# Patient Record
Sex: Female | Born: 1990 | Race: Black or African American | Hispanic: No | Marital: Single | Smoking: Never smoker
Health system: Southern US, Community
[De-identification: ages and names within clinical notes are randomized; demographics above are authoritative.]

---
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
## 2011-12-24 ENCOUNTER — Encounter: Payer: Self-pay | Admitting: Emergency Medicine

## 2011-12-24 ENCOUNTER — Emergency Department (INDEPENDENT_AMBULATORY_CARE_PROVIDER_SITE_OTHER)
Admission: EM | Admit: 2011-12-24 | Discharge: 2011-12-24 | Disposition: A | Discharge status: Home / Self Care | Payer: PRIVATE HEALTH INSURANCE | Source: Home / Self Care | Attending: Emergency Medicine | Admitting: Emergency Medicine

## 2011-12-24 DIAGNOSIS — Z01419 Encounter for gynecological examination (general) (routine) without abnormal findings: Secondary | ICD-10-CM

## 2011-12-24 LAB — POCT URINALYSIS DIP (DEVICE)
Glucose, UA: NEGATIVE mg/dL
Ketones, ur: NEGATIVE mg/dL
Leukocytes, UA: NEGATIVE
Nitrite: NEGATIVE
Protein, ur: 30 mg/dL — AB
Specific Gravity, Urine: >=1.03 (ref 1.005–1.030)
Urobilinogen, UA: 0.2 mg/dL (ref 0.0–1.0)
pH: 5.5 (ref 5.0–8.0)

## 2011-12-24 LAB — POCT PREGNANCY, URINE: Preg Test, Ur: NEGATIVE

## 2011-12-24 NOTE — ED Notes (Signed)
Here with concerns of extra flap of skin  below clitoris s/p piercing 11/23/11.denies pain or drainage.piercing intact.

## 2011-12-24 NOTE — ED Provider Notes (Signed)
History     CSN: [id number]  Arrival date & time 12/24/11  1036   First MD Initiated Contact with Patient 12/24/11 1054      Chief Complaint  Patient presents with  . Vaginal Injury  . New Evaluation    (Consider location/radiation/quality/duration/timing/severity/associated sxs/prior treatment) HPI Comments: Elaine Hubbard had a piercing done of her genital area on December 8, 1 month ago. The piercing has healed up well, but she can feel a small bump just underneath piercing. It's not been tender. She wonders what this could be. She denies any drainage or abnormal bleeding. She's otherwise in good health with no other GYN complaints including discharge, itching, or pain, fevers, chills, nausea, or vomiting.  Patient is a 21 y.o. female presenting with vaginal injury.  Vaginal Injury Pertinent negatives include no abdominal pain.    History reviewed. No pertinent past medical history.  History reviewed. No pertinent past surgical history.  History reviewed. No pertinent family history.  History  Substance Use Topics  . Smoking status: Never Smoker   . Smokeless tobacco: Not on file  . Alcohol Use: No    OB History    Grav Para Term Preterm Abortions TAB SAB Ect Mult Living                  Review of Systems  Constitutional: Negative for fever and chills.  Gastrointestinal: Negative for nausea, vomiting, abdominal pain and diarrhea.  Genitourinary: Negative for dysuria, urgency, frequency, hematuria, vaginal bleeding, vaginal discharge, difficulty urinating, genital sores, vaginal pain, menstrual problem, pelvic pain and dyspareunia.    Allergies  Review of patient's allergies indicates no known allergies.  Home Medications  No current outpatient prescriptions on file.  BP 121/73  Pulse 62  Temp(Src) 98.4 F (36.9 C) (Oral)  Resp 16  SpO2 100%  LMP 12/09/2011  Physical Exam  Nursing note and vitals reviewed. Constitutional: She appears well-developed and  well-nourished. No distress.  Cardiovascular: Normal rate, regular rhythm, normal heart sounds and intact distal pulses.  Exam reveals no gallop and no friction rub.   No murmur heard. Pulmonary/Chest: Effort normal and breath sounds normal. No respiratory distress. She has no wheezes. She has no rales.  Abdominal: Soft. Bowel sounds are normal. She exhibits no distension and no mass. There is no tenderness. There is no rebound and no guarding.  Genitourinary: Vagina normal and uterus normal. There is no rash or lesion on the right labia. There is no rash or lesion on the left labia. Uterus is not deviated, not enlarged, not fixed and not tender. Cervix exhibits no motion tenderness, no discharge and no friability. Right adnexum displays no mass and no tenderness. Left adnexum displays no mass and no tenderness. No erythema, tenderness or bleeding around the vagina. No foreign body around the vagina. No vaginal discharge found.        external genital exam reveals she has a piercing of the clitoral hood. Underneath this is a normal appearing clitoris which is the bump that she is referring to. She does not have an abscess or any sign of infection. There is no drainage or abnormal bleeding. No other is were noted externally exam was not necessary.  Skin: Skin is warm and dry. No rash noted. She is not diaphoretic.    ED Course  Procedures (including critical care time)  Labs Reviewed  POCT URINALYSIS DIP (DEVICE) - Abnormal; Notable for the following:    Bilirubin Urine SMALL (*)    Hgb urine dipstick  SMALL (*)    Protein, ur 30 (*)    All other components within normal limits  POCT PREGNANCY, URINE  POCT URINALYSIS DIPSTICK  POCT PREGNANCY, URINE   No results found.   1. Gynecologic exam normal       MDM  Normal examination. The bump that she is referring to appears to be a normal clitoris. She does not need any treatment.        Roque Lias, MD 12/24/11 [phone]

## 2012-09-12 ENCOUNTER — Emergency Department (HOSPITAL_COMMUNITY)
Admission: EM | Admit: 2012-09-12 | Discharge: 2012-09-12 | Disposition: A | Discharge status: Home / Self Care | Payer: No Typology Code available for payment source | Source: Home / Self Care

## 2012-09-12 ENCOUNTER — Encounter (HOSPITAL_COMMUNITY): Payer: Self-pay | Admitting: Emergency Medicine

## 2012-09-12 DIAGNOSIS — L299 Pruritus, unspecified: Secondary | ICD-10-CM

## 2012-09-12 MED ORDER — HYDROXYZINE HCL 25 MG PO TABS: 25.0000 mg | ORAL_TABLET | Freq: Four times a day (QID) | ORAL | Status: DC

## 2012-09-12 NOTE — ED Provider Notes (Signed)
Medical screening examination/treatment/procedure(s) were performed by resident physician or non-physician practitioner and as supervising physician I was immediately available for consultation/collaboration.   [name],[name] MD.    James D [name], MD 09/12/12 1920 

## 2012-09-12 NOTE — ED Notes (Signed)
Pt c/o itching all over her body except neck and face x3 months... Went to [facility] and was given Triamcinolone and Prednisone.... Sx include: rash on left hand, itching all over body... Denies: fevers, vomiting, nausea, diarrhea, does not recall any new hygiene products or laundry detergent.

## 2012-09-12 NOTE — ED Provider Notes (Signed)
History     CSN: [id number]  Arrival date & time 09/12/12  1756   None     Chief Complaint  Patient presents with  . Pruritis    (Consider location/radiation/quality/duration/timing/severity/associated sxs/prior treatment) Patient is a 21 y.o. female presenting with rash. The history is provided by the patient. No language interpreter was used.  Rash  This is a new problem. The problem has not changed since onset.The rash is present on the torso. The pain is at a severity of 0/10. Associated symptoms include itching. She has tried antihistamines for the symptoms. The treatment provided no relief.   Pt complains of itching full body.  No rash.   Pt was treated at another urgent care with triamcinalone and prednisone History reviewed. No pertinent past medical history.  History reviewed. No pertinent past surgical history.  No family history on file.  History  Substance Use Topics  . Smoking status: Never Smoker   . Smokeless tobacco: Not on file  . Alcohol Use: No    OB History    Grav Para Term Preterm Abortions TAB SAB Ect Mult Living                  Review of Systems  Skin: Positive for itching and rash.  All other systems reviewed and are negative.    Allergies  Review of patient's allergies indicates no known allergies.  Home Medications   Current Outpatient Rx  Name Route Sig Dispense Refill  . PREDNISONE 20 MG PO TABS Oral Take 20 mg by mouth daily.    . TRIAMCINOLONE ACETONIDE 0.025 % EX CREA Topical Apply topically 2 (two) times daily.      BP 114/73  Pulse 74  Temp 98.6 F (37 C) (Oral)  Resp 16  SpO2 100%  LMP 09/12/2012  Physical Exam  Nursing note and vitals reviewed. Constitutional: She appears well-developed and well-nourished.  HENT:  Head: Normocephalic and atraumatic.  Eyes: Pupils are equal, round, and reactive to light.  Neck: Normal range of motion.  Cardiovascular: Normal rate and normal heart sounds.   Pulmonary/Chest:  Effort normal.  Musculoskeletal: Normal range of motion.  Neurological: She is alert.  Skin: No rash noted.  Psychiatric: She has a normal mood and affect.    ED Course  Procedures (including critical care time)  Labs Reviewed - No data to display No results found.   No diagnosis found.    MDM  Pt advised to see dermatologist for evaluation.   I will treat with atarax.   I advised hypoallergenic products        Lonia Skinner [place], [place] 09/12/12 [phone]

## 2012-09-22 ENCOUNTER — Emergency Department (HOSPITAL_COMMUNITY)
Admission: EM | Admit: 2012-09-22 | Discharge: 2012-09-22 | Disposition: A | Discharge status: Home / Self Care | Payer: No Typology Code available for payment source | Attending: Emergency Medicine | Admitting: Emergency Medicine

## 2012-09-22 ENCOUNTER — Encounter (HOSPITAL_COMMUNITY): Payer: Self-pay | Admitting: Physical Medicine and Rehabilitation

## 2012-09-22 DIAGNOSIS — R748 Abnormal levels of other serum enzymes: Secondary | ICD-10-CM

## 2012-09-22 DIAGNOSIS — L299 Pruritus, unspecified: Secondary | ICD-10-CM | POA: Insufficient documentation

## 2012-09-22 DIAGNOSIS — L309 Dermatitis, unspecified: Secondary | ICD-10-CM

## 2012-09-22 LAB — CBC WITH DIFFERENTIAL/PLATELET
Basophils Relative: 1 % (ref 0–1)
Hemoglobin: 12.6 g/dL (ref 12.0–15.0)
Lymphocytes Relative: 30 % (ref 12–46)
MCHC: 33.3 g/dL (ref 30.0–36.0)
Monocytes Relative: 5 % (ref 3–12)
Neutro Abs: 2.8 10*3/uL (ref 1.7–7.7)
Neutrophils Relative %: 61 % (ref 43–77)
RBC: 4.59 MIL/uL (ref 3.87–5.11)
WBC: 4.6 10*3/uL (ref 4.0–10.5)

## 2012-09-22 LAB — COMPREHENSIVE METABOLIC PANEL
AST: 54 U/L — ABNORMAL HIGH (ref 0–37)
Albumin: 4.2 g/dL (ref 3.5–5.2)
Alkaline Phosphatase: 38 U/L — ABNORMAL LOW (ref 39–117)
BUN: 7 mg/dL (ref 6–23)
CO2: 27 mEq/L (ref 19–32)
Chloride: 103 mEq/L (ref 96–112)
Potassium: 3.4 mEq/L — ABNORMAL LOW (ref 3.5–5.1)
Total Bilirubin: 1.4 mg/dL — ABNORMAL HIGH (ref 0.3–1.2)

## 2012-09-22 MED ORDER — HYDROXYZINE HCL 25 MG PO TABS: 25.0000 mg | ORAL_TABLET | Freq: Four times a day (QID) | ORAL | Status: DC | PRN

## 2012-09-22 NOTE — ED Provider Notes (Signed)
History  Scribed for Benny Lennert, MD, the patient was seen in room TR04C/TR04C. This chart was scribed by Candelaria Stagers. The patient's care started at 11:41 AM   CSN: [id number]  Arrival date & time 09/22/12  1478   First MD Initiated Contact with Patient 09/22/12 1140      Chief Complaint  Patient presents with  . Pruritis     The history is provided by the patient. No language interpreter was used.   Elaine Hubbard is a 21 y.o. female who presents to the Emergency Department complaining of pruritis all over her body that started about three months ago with no relief.  Pt has been seen in Urgent Care.  She denies SOB or rash.  She has had no relief with creams or medications.     No past medical history on file.  No past surgical history on file.  No family history on file.  History  Substance Use Topics  . Smoking status: Never Smoker   . Smokeless tobacco: Not on file  . Alcohol Use: No    OB History    Grav Para Term Preterm Abortions TAB SAB Ect Mult Living                  Review of Systems  Constitutional: Negative for fatigue.  HENT: Negative for congestion, sinus pressure and ear discharge.   Eyes: Negative for discharge.  Respiratory: Negative for cough.   Cardiovascular: Negative for chest pain.  Gastrointestinal: Negative for abdominal pain and diarrhea.  Genitourinary: Negative for frequency and hematuria.  Musculoskeletal: Negative for back pain.  Skin:       Pruritis   Neurological: Negative for seizures and headaches.  Hematological: Negative.   Psychiatric/Behavioral: Negative for hallucinations.    Allergies  Review of patient's allergies indicates no known allergies.  Home Medications   Current Outpatient Rx  Name Route Sig Dispense Refill  . HYDROXYZINE HCL 25 MG PO TABS Oral Take 1 tablet (25 mg total) by mouth every 6 (six) hours. 20 tablet 0  . TRIAMCINOLONE ACETONIDE 0.025 % EX CREA Topical Apply topically 2 (two) times  daily.      BP 127/62  Pulse 80  Temp 98.2 F (36.8 C) (Oral)  Resp 16  SpO2 100%  LMP 09/12/2012  Physical Exam  Constitutional: She is oriented to person, place, and time. She appears well-developed.  HENT:  Head: Normocephalic.  Eyes: Conjunctivae normal are normal.  Neck: No tracheal deviation present.  Cardiovascular:  No murmur heard. Pulmonary/Chest: No respiratory distress.  Musculoskeletal: Normal range of motion.  Neurological: She is oriented to person, place, and time.  Skin: Skin is warm.  Psychiatric: She has a normal mood and affect.    ED Course  Procedures   DIAGNOSTIC STUDIES: Oxygen Saturation is 100% on room air, normal by my interpretation.    COORDINATION OF CARE:  11:44 Ordered: CBC with Differential; Comprehensive metabolic panel.   1:14 PM Recheck: Discussed lab results with pt and need for f/u for liver study.    Labs Reviewed  COMPREHENSIVE METABOLIC PANEL - Abnormal; Notable for the following:    Potassium 3.4 (*)     AST 54 (*)     ALT 69 (*)     Alkaline Phosphatase 38 (*)     Total Bilirubin 1.4 (*)     All other components within normal limits  CBC WITH DIFFERENTIAL   No results found.   No diagnosis found.  MDM    The chart was scribed for me under my direct supervision.  I personally performed the history, physical, and medical decision making and all procedures in the evaluation of this patient.Benny Lennert, MD 09/22/12 1323

## 2012-09-22 NOTE — ED Notes (Signed)
Pt presents to department for evaluation of itching all over body. Ongoing x3 months. No relief with creams and medications. Respirations unlabored. Denies SOB. She is alert and oriented x4. No signs of acute distress noted at the time.

## 2012-09-22 NOTE — ED Notes (Signed)
Phlebotomy at the bedside  

## 2012-09-23 ENCOUNTER — Encounter (HOSPITAL_COMMUNITY): Payer: Self-pay | Admitting: Emergency Medicine

## 2012-09-23 ENCOUNTER — Emergency Department (HOSPITAL_COMMUNITY)
Admission: EM | Admit: 2012-09-23 | Discharge: 2012-09-23 | Disposition: A | Discharge status: Home / Self Care | Payer: No Typology Code available for payment source | Attending: Emergency Medicine | Admitting: Emergency Medicine

## 2012-09-23 DIAGNOSIS — N764 Abscess of vulva: Secondary | ICD-10-CM | POA: Insufficient documentation

## 2012-09-23 MED ORDER — CEPHALEXIN 500 MG PO CAPS: 500.0000 mg | ORAL_CAPSULE | Freq: Four times a day (QID) | ORAL | Status: DC

## 2012-09-23 NOTE — ED Provider Notes (Signed)
Medical screening examination/treatment/procedure(s) were performed by non-physician practitioner and as supervising physician I was immediately available for consultation/collaboration.   Glynn Octave, MD 09/23/12 [phone]

## 2012-09-23 NOTE — ED Notes (Signed)
Pt repots having bump on labia; noticed a few days ago; no redness per pt and no swelling per pt

## 2012-09-23 NOTE — ED Provider Notes (Signed)
History     CSN: [id number]  Arrival date & time 09/23/12  2004   First MD Initiated Contact with Patient 09/23/12 2129      Chief Complaint  Patient presents with  . Abscess    (Consider location/radiation/quality/duration/timing/severity/associated sxs/prior treatment) Patient is a 21 y.o. female presenting with abscess. The history is provided by the patient.  Abscess  This is a new problem. The current episode started less than one week ago. The problem has been unchanged. The abscess is present on the genitalia. The problem is mild. The abscess is characterized by swelling and painfulness. Pertinent negatives include no fever.  Pt stats she has a small bump on the right labia majora, that is mildly tender. First noted about 3 days ago. No drainage. Not itching. No vaginal discharge. No fever, chills, malaise.   History reviewed. No pertinent past medical history.  History reviewed. No pertinent past surgical history.  No family history on file.  History  Substance Use Topics  . Smoking status: Never Smoker   . Smokeless tobacco: Not on file  . Alcohol Use: No    OB History    Grav Para Term Preterm Abortions TAB SAB Ect Mult Living                  Review of Systems  Constitutional: Negative for fever and chills.  Respiratory: Negative.   Cardiovascular: Negative.   Genitourinary: Positive for genital sores and vaginal pain. Negative for vaginal bleeding and vaginal discharge.  Skin: Positive for wound.  Hematological: Negative for adenopathy.    Allergies  Review of patient's allergies indicates no known allergies.  Home Medications   Current Outpatient Rx  Name Route Sig Dispense Refill  . HYDROXYZINE HCL 25 MG PO TABS Oral Take 1 tablet (25 mg total) by mouth every 6 (six) hours. 20 tablet 0  . HYDROXYZINE HCL 25 MG PO TABS Oral Take 1 tablet (25 mg total) by mouth every 6 (six) hours as needed for itching. 30 tablet 0  . TRIAMCINOLONE ACETONIDE 0.025  % EX CREA Topical Apply topically 2 (two) times daily.      BP 106/74  Pulse 77  Temp 98.8 F (37.1 C) (Oral)  Resp 18  SpO2 100%  LMP 09/12/2012  Physical Exam  Nursing note and vitals reviewed. Constitutional: She appears well-developed and well-nourished. No distress.  Neck: Neck supple.  Cardiovascular: Normal rate, regular rhythm and normal heart sounds.   Pulmonary/Chest: Effort normal and breath sounds normal. No respiratory distress. She has no wheezes. She has no rales.  Genitourinary:       About 1cm indurated, fluctuant, mildly tender area to the right labia near urethral opening. Appears to be a small abscess. No drainage. No other lesions or abnormalities. No inguinal lymphadenopathy  Neurological: She is alert.  Skin: Skin is warm and dry.    ED Course  Procedures (including critical care time)  Pt with small abscess to the right labia majora. This does not appear to be a bartholin's gland abscess, because it is located more frontal. I offered pt to have it I&Ded, which pt refused. Will do warm soaks at home, antibiotics, return if worsening, in which explained to pt the only way to get rid of it is to have it drained. Pt voiced understanding. At this time, pt is afebrile, non toxic, stable for d/c home.   1. Abscess of labia majora       MDM  Lottie Mussel, PA 09/23/12 2310

## 2014-02-08 ENCOUNTER — Emergency Department (HOSPITAL_COMMUNITY)
Admission: EM | Admit: 2014-02-08 | Discharge: 2014-02-08 | Disposition: A | Discharge status: Home / Self Care | Payer: No Typology Code available for payment source | Source: Home / Self Care | Attending: Family Medicine | Admitting: Family Medicine

## 2014-02-08 ENCOUNTER — Encounter (HOSPITAL_COMMUNITY): Payer: Self-pay | Admitting: Emergency Medicine

## 2014-02-08 ENCOUNTER — Other Ambulatory Visit (HOSPITAL_COMMUNITY)
Admission: RE | Admit: 2014-02-08 | Discharge: 2014-02-08 | Disposition: A | Discharge status: Home / Self Care | Payer: No Typology Code available for payment source | Source: Ambulatory Visit | Attending: Family Medicine | Admitting: Family Medicine

## 2014-02-08 DIAGNOSIS — Z113 Encounter for screening for infections with a predominantly sexual mode of transmission: Secondary | ICD-10-CM | POA: Insufficient documentation

## 2014-02-08 DIAGNOSIS — N760 Acute vaginitis: Secondary | ICD-10-CM | POA: Insufficient documentation

## 2014-02-08 LAB — POCT PREGNANCY, URINE: PREG TEST UR: NEGATIVE

## 2014-02-08 MED ORDER — FLUCONAZOLE 150 MG PO TABS: 150.0000 mg | ORAL_TABLET | Freq: Once | ORAL | Status: DC

## 2014-02-08 MED ORDER — METRONIDAZOLE 500 MG PO TABS: 500.0000 mg | ORAL_TABLET | Freq: Two times a day (BID) | ORAL | Status: DC

## 2014-02-08 NOTE — Discharge Instructions (Signed)
Vaginitis Vaginitis is an inflammation of the vagina. It is most often caused by a change in the normal balance of the bacteria and yeast that live in the vagina. This change in balance causes an overgrowth of certain bacteria or yeast, which causes the inflammation. There are different types of vaginitis, but the most common types are:  Bacterial vaginosis.  Yeast infection (candidiasis).  Trichomoniasis vaginitis. This is a sexually transmitted infection (STI).  Viral vaginitis.  Atropic vaginitis.  Allergic vaginitis. CAUSES  The cause depends on the type of vaginitis. Vaginitis can be caused by:  Bacteria (bacterial vaginosis).  Yeast (yeast infection).  A parasite (trichomoniasis vaginitis)  A virus (viral vaginitis).  Low hormone levels (atrophic vaginitis). Low hormone levels can occur during pregnancy, breastfeeding, or after menopause.  Irritants, such as bubble baths, scented tampons, and feminine sprays (allergic vaginitis). Other factors can change the normal balance of the yeast and bacteria that live in the vagina. These include:  Antibiotic medicines.  Poor hygiene.  Diaphragms, vaginal sponges, spermicides, birth control pills, and intrauterine devices (IUD).  Sexual intercourse.  Infection.  Uncontrolled diabetes.  A weakened immune system. SYMPTOMS  Symptoms can vary depending on the cause of the vaginitis. Common symptoms include:  Abnormal vaginal discharge.  The discharge is white, gray, or yellow with bacterial vaginosis.  The discharge is thick, white, and cheesy with a yeast infection.  The discharge is frothy and yellow or greenish with trichomoniasis.  A bad vaginal odor.  The odor is fishy with bacterial vaginosis.  Vaginal itching, pain, or swelling.  Painful intercourse.  Pain or burning when urinating. Sometimes, there are no symptoms. TREATMENT  Treatment will vary depending on the type of infection.   Bacterial  vaginosis and trichomoniasis are often treated with antibiotic creams or pills.  Yeast infections are often treated with antifungal medicines, such as vaginal creams or suppositories.  Viral vaginitis has no cure, but symptoms can be treated with medicines that relieve discomfort. Your sexual partner should be treated as well.  Atrophic vaginitis may be treated with an estrogen cream, pill, suppository, or vaginal ring. If vaginal dryness occurs, lubricants and moisturizing creams may help. You may be told to avoid scented soaps, sprays, or douches.  Allergic vaginitis treatment involves quitting the use of the product that is causing the problem. Vaginal creams can be used to treat the symptoms. HOME CARE INSTRUCTIONS   Take all medicines as directed by your caregiver.  Keep your genital area clean and dry. Avoid soap and only rinse the area with water.  Avoid douching. It can remove the healthy bacteria in the vagina.  Do not use tampons or have sexual intercourse until your vaginitis has been treated. Use sanitary pads while you have vaginitis.  Wipe from front to back. This avoids the spread of bacteria from the rectum to the vagina.  Let air reach your genital area.  Wear cotton underwear to decrease moisture buildup.  Avoid wearing underwear while you sleep until your vaginitis is gone.  Avoid tight pants and underwear or nylons without a cotton panel.  Take off wet clothing (especially bathing suits) as soon as possible.  Use mild, non-scented products. Avoid using irritants, such as:  Scented feminine sprays.  Fabric softeners.  Scented detergents.  Scented tampons.  Scented soaps or bubble baths.  Practice safe sex and use condoms. Condoms may prevent the spread of trichomoniasis and viral vaginitis. SEEK MEDICAL CARE IF:   You have abdominal pain.  You   have a fever or persistent symptoms for more than 2 3 days.  You have a fever and your symptoms suddenly  get worse. Document Released: 09/29/2007 Document Revised: 08/26/2012 Document Reviewed: 05/14/2012 [employer] Patient Information 2014 [employer].  

## 2014-02-08 NOTE — ED Notes (Signed)
Pt  Reports  Swelling  At  The  Bottom  Of  Her  Vagina        X  4 days       She  denys  Any  Discharge  Or  blledding  She  Is  Awake  And  Alert  And  Is  Sitting  Upright         On  Exam table  Speaking in  Complete  sentances

## 2014-02-08 NOTE — ED Provider Notes (Signed)
CSN: [id number]     Arrival date & time 02/08/14  1047 History   First MD Initiated Contact with Patient 02/08/14 1108     Chief Complaint  Patient presents with  . Groin Swelling     (Consider location/radiation/quality/duration/timing/severity/associated sxs/prior Treatment) HPI Comments: 23 year old female presents complaining of possible yeast infection. This started on Saturday. She states that when she has sex, she has irritation on the back of her vagina and it is swollen. This area is now itchy and still seems somewhat swollen. She denies any pain, discharge, pelvic pain. She does not think she could have an STD but she admits that she is unsure. She has never had this problem before.  She further clarifies this to mean that she has a sensation of swelling and decreased lubrication on the posterior vaginal wall the past 5 months during intercourse   History reviewed. No pertinent past medical history. History reviewed. No pertinent past surgical history. No family history on file. History  Substance Use Topics  . Smoking status: Never Smoker   . Smokeless tobacco: Not on file  . Alcohol Use: No   OB History   Grav Para Term Preterm Abortions TAB SAB Ect Mult Living                 Review of Systems  Constitutional: Negative for fever and chills.  Eyes: Negative for visual disturbance.  Respiratory: Negative for cough and shortness of breath.   Cardiovascular: Negative for chest pain, palpitations and leg swelling.  Gastrointestinal: Negative for nausea, vomiting and abdominal pain.  Endocrine: Negative for polydipsia and polyuria.  Genitourinary:       See HPI  Musculoskeletal: Negative for arthralgias and myalgias.  Skin: Negative for rash.  Neurological: Negative for dizziness, weakness and light-headedness.      Allergies  Review of patient's allergies indicates no known allergies.  Home Medications   Current Outpatient Rx  Name  Route  Sig  Dispense   Refill  . cephALEXin (KEFLEX) 500 MG capsule   Oral   Take 1 capsule (500 mg total) by mouth 4 (four) times daily.   28 capsule   0   . fluconazole (DIFLUCAN) 150 MG tablet   Oral   Take 1 tablet (150 mg total) by mouth once. Pick up the refill and and take second dose in 5 days if symptoms have not resolved   1 tablet   1   . metroNIDAZOLE (FLAGYL) 500 MG tablet   Oral   Take 1 tablet (500 mg total) by mouth 2 (two) times daily.   14 tablet   0   . triamcinolone (KENALOG) 0.025 % cream   Topical   Apply topically 2 (two) times daily.          BP 117/79  Pulse 79  Temp(Src) 98 F (36.7 C) (Oral)  Resp 20  SpO2 99%  LMP 01/31/2014 Physical Exam  Nursing note and vitals reviewed. Constitutional: She is oriented to person, place, and time. Vital signs are normal. She appears well-developed and well-nourished. No distress.  HENT:  Head: Normocephalic and atraumatic.  Pulmonary/Chest: Effort normal. No respiratory distress.  Abdominal: Soft. Normal appearance. There is no tenderness.  Genitourinary: There is no lesion on the right labia. There is no lesion on the left labia. Cervix exhibits no discharge. No erythema, tenderness or bleeding around the vagina. No signs of injury around the vagina. Vaginal discharge (Profuse thin white and clumpy discharge) found.  Papillae on the  posterior vaginal wall  Lymphadenopathy:       Right: No inguinal adenopathy present.       Left: No inguinal adenopathy present.  Neurological: She is alert and oriented to person, place, and time. She has normal strength. Coordination normal.  Skin: Skin is warm and dry. No rash noted. She is not diaphoretic.  Psychiatric: She has a normal mood and affect. Judgment normal.    ED Course  Procedures (including critical care time) Labs Review Labs Reviewed  POCT PREGNANCY, URINE  CERVICOVAGINAL ANCILLARY ONLY   Imaging Review No results found.    MDM   Final diagnoses:  Vaginitis     Unsure of the significance of his physical exam findings. I will refer this patient to GYN for further evaluation. There is a significant amount of discharge, I believe she has BV and yeast infection, we will treat this.   Meds ordered this encounter  Medications  . fluconazole (DIFLUCAN) 150 MG tablet    Sig: Take 1 tablet (150 mg total) by mouth once. Pick up the refill and and take second dose in 5 days if symptoms have not resolved    Dispense:  1 tablet    Refill:  1    Order Specific Question:  Supervising Provider    Answer:  Clementeen GrahamOREY, EVAN, S [[id number]]  . metroNIDAZOLE (FLAGYL) 500 MG tablet    Sig: Take 1 tablet (500 mg total) by mouth 2 (two) times daily.    Dispense:  14 tablet    Refill:  0    Order Specific Question:  Supervising Provider    Answer:  Clementeen GrahamOREY, EVAN, Kathie RhodesS [[id number]]       Graylon GoodZachary H Elizeth Weinrich, PA-C 02/08/14 [phone]

## 2014-02-09 LAB — CERVICOVAGINAL ANCILLARY ONLY
Chlamydia: NEGATIVE
NEISSERIA GONORRHEA: NEGATIVE

## 2014-02-09 NOTE — ED Provider Notes (Signed)
Medical screening examination/treatment/procedure(s) were performed by a resident physician or non-physician practitioner and as the supervising physician I was immediately available for consultation/collaboration.  Evan Corey, MD    Evan S Corey, MD 02/09/14 0747 

## 2014-02-09 NOTE — ED Notes (Signed)
GC/Chlamydia neg., Affirm: Candida and Trich neg., Gardnerella pos. Pt. adequately treated with Flagyl. Vassie MoselleYork, Jasmeet Gehl M 02/09/2014

## 2014-02-10 LAB — CERVICOVAGINAL ANCILLARY ONLY
WET PREP (BD AFFIRM): NEGATIVE
Wet Prep (BD Affirm): NEGATIVE
Wet Prep (BD Affirm): POSITIVE — AB

## 2014-02-14 NOTE — ED Notes (Signed)
Patient called to inquire about her lab reports . After verifying ID, discussed negative findings, and to finish her flagyl rx

## 2014-05-11 ENCOUNTER — Other Ambulatory Visit (HOSPITAL_COMMUNITY)
Admission: RE | Admit: 2014-05-11 | Discharge: 2014-05-11 | Disposition: A | Discharge status: Home / Self Care | Payer: No Typology Code available for payment source | Source: Ambulatory Visit | Attending: Obstetrics & Gynecology | Admitting: Obstetrics & Gynecology

## 2014-05-11 ENCOUNTER — Other Ambulatory Visit: Payer: Self-pay | Admitting: Obstetrics & Gynecology

## 2014-05-11 DIAGNOSIS — Z01419 Encounter for gynecological examination (general) (routine) without abnormal findings: Secondary | ICD-10-CM | POA: Insufficient documentation

## 2015-10-24 ENCOUNTER — Ambulatory Visit (INDEPENDENT_AMBULATORY_CARE_PROVIDER_SITE_OTHER): Payer: No Typology Code available for payment source | Admitting: Internal Medicine

## 2015-10-24 VITALS — BP 108/64 | HR 88 | Temp 98.4°F | Resp 16 | Ht 60.0 in | Wt 98.8 lb

## 2015-10-24 DIAGNOSIS — Z0000 Encounter for general adult medical examination without abnormal findings: Secondary | ICD-10-CM

## 2015-10-24 DIAGNOSIS — Z23 Encounter for immunization: Secondary | ICD-10-CM | POA: Diagnosis not present

## 2015-10-24 NOTE — Progress Notes (Addendum)
   Subjective:    Patient ID: Elaine Hubbard, female    DOB: 09/26/1991, 24 y.o.   MRN: [id number] This chart was scribed for Tami Lin, MD by Marti Sleigh, Medical Scribe. This patient was seen in Room 5 and the patient's care was started a 4:03 PM.  Chief Complaint  Patient presents with  . Annual Exam    complete physical  . Immunizations    tb  . paperwork    pt. 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 female who presents to [facility] [place] [facility] reporting for a full physical. She states she is healthy in general, and does not take any medications.   History reviewed. No pertinent past medical history.  No Known Allergies  No current outpatient prescriptions on file prior to visit.   No current facility-administered medications on file prior to visit.   Immuniz review=needs Tdap Has all others  ?titers for MMR and Varicella and Hepb Ab  Review of Systems  Constitutional: Negative for fever and chills.  Cardiovascular: Negative for chest pain and palpitations.  Gastrointestinal: Negative for nausea and abdominal pain.      Objective:  BP 108/64 mmHg  Pulse 88  Temp(Src) 98.4 F (36.9 C) (Oral)  Resp 16  Ht 5' (1.524 m)  Wt 98 lb 12.8 oz (44.815 kg)  BMI 19.30 kg/m2  SpO2 99%  LMP 10/15/2015 (Exact Date)  Physical Exam  Constitutional: She is oriented to person, place, and time. She appears well-developed and well-nourished. No distress.  HENT:  Head: Normocephalic and atraumatic.  Right Ear: External ear normal.  Left Ear: External ear normal.  Nose: Nose normal.  Mouth/Throat: Oropharynx is clear and moist.  Eyes: Conjunctivae and EOM are normal. Pupils are equal, round, and reactive to light.  Neck: Normal range of motion. Neck supple. No thyromegaly present.  Cardiovascular: Normal rate, regular rhythm, normal heart sounds and intact distal pulses.   No  murmur heard. Pulmonary/Chest: Effort normal and breath sounds normal. No respiratory distress. She has no wheezes.  Abdominal: Soft. Bowel sounds are normal. She exhibits no distension and no mass. There is no tenderness. There is no rebound.  Musculoskeletal: Normal range of motion. She exhibits no edema or tenderness.  Lymphadenopathy:    She has no cervical adenopathy.  Neurological: She is alert and oriented to person, place, and time. She has normal reflexes. No cranial nerve deficit. Coordination normal.  Skin: Skin is warm and dry. No rash noted. She is not diaphoretic.  Psychiatric: She has a normal mood and affect. Her behavior is normal. Judgment and thought content normal.  Nursing note and vitals reviewed.     Assessment & Plan:  Healthy with Health maintenance issues  Tdap Titers TB skin test  I have completed the patient encounter in its entirety as documented by the scribe, with editing by me where necessary. Cornellius Kropp P. Laney Pastor, M.D.   By signing my name below, I, Judithe Modest, attest that this documentation has been prepared under the direction and in the presence of Tami Lin, MD. Electronically Signed: Judithe Modest, ER Scribe. 10/24/2015. 4:03 PM.

## 2015-10-25 LAB — HEPATITIS B SURFACE ANTIBODY, QUANTITATIVE: Hepatitis B-Post: 0 m[IU]/mL

## 2015-10-25 LAB — MEASLES/MUMPS/RUBELLA IMMUNITY
Mumps IgG: 73.4 AU/mL — ABNORMAL HIGH (ref <9.00)
RUBELLA: 6.49 {index} — AB (ref <0.90)
Rubeola IgG: 8.86 AU/mL (ref <25.00)

## 2015-10-25 LAB — VARICELLA ZOSTER ANTIBODY, IGG: Varicella IgG: 1800 Index — ABNORMAL HIGH (ref <135.00)

## 2015-10-27 ENCOUNTER — Ambulatory Visit (INDEPENDENT_AMBULATORY_CARE_PROVIDER_SITE_OTHER): Payer: No Typology Code available for payment source

## 2015-10-27 DIAGNOSIS — Z111 Encounter for screening for respiratory tuberculosis: Secondary | ICD-10-CM

## 2015-10-27 DIAGNOSIS — Z7689 Persons encountering health services in other specified circumstances: Secondary | ICD-10-CM

## 2015-10-27 LAB — TB SKIN TEST
INDURATION: 0 mm
TB SKIN TEST: NEGATIVE

## 2015-11-28 ENCOUNTER — Ambulatory Visit (INDEPENDENT_AMBULATORY_CARE_PROVIDER_SITE_OTHER): Payer: No Typology Code available for payment source | Admitting: Family Medicine

## 2015-11-28 DIAGNOSIS — Z111 Encounter for screening for respiratory tuberculosis: Secondary | ICD-10-CM | POA: Diagnosis not present

## 2015-11-28 NOTE — Progress Notes (Signed)
Note reviewed, and agree with documentation and plan.  

## 2015-11-28 NOTE — Progress Notes (Signed)
Patient here for second TB administration only. This was administered by Trenda MootsMichelle Farrington RT-C per my direction. Patient without complaints today. Deliah BostonMichael Renaud Celli, MS, PA-C 10:32 AM, 11/28/2015

## 2015-12-01 ENCOUNTER — Ambulatory Visit (INDEPENDENT_AMBULATORY_CARE_PROVIDER_SITE_OTHER): Payer: No Typology Code available for payment source | Admitting: *Deleted

## 2015-12-01 DIAGNOSIS — Z111 Encounter for screening for respiratory tuberculosis: Secondary | ICD-10-CM

## 2015-12-01 LAB — TB SKIN TEST
Induration: 0 mm
TB Skin Test: NEGATIVE

## 2015-12-01 NOTE — Progress Notes (Signed)
   Subjective:    Patient ID: Elaine Hubbard, female    DOB: [birth year], 24 y.o.   MRN: [id number]  HPI  PPD reading was negative with 0 mm induration. Patient was seen within her given window for the reading.  Review of Systems     Objective:   Physical Exam        Assessment & Plan:

## 2015-12-09 ENCOUNTER — Ambulatory Visit: Payer: No Typology Code available for payment source | Admitting: Physician Assistant

## 2015-12-09 VITALS — BP 100/64 | HR 76 | Temp 98.8°F | Resp 18 | Ht 60.5 in | Wt 95.0 lb

## 2015-12-09 DIAGNOSIS — Z5321 Procedure and treatment not carried out due to patient leaving prior to being seen by health care provider: Secondary | ICD-10-CM

## 2015-12-09 NOTE — Progress Notes (Signed)
Elaine Hubbard was seen today for immunizations.  Diagnoses and all orders for this visit:  Patient left without being seen

## 2015-12-21 ENCOUNTER — Ambulatory Visit (INDEPENDENT_AMBULATORY_CARE_PROVIDER_SITE_OTHER): Payer: No Typology Code available for payment source | Admitting: Urgent Care

## 2015-12-21 ENCOUNTER — Encounter: Payer: Self-pay | Admitting: Urgent Care

## 2015-12-21 DIAGNOSIS — Z111 Encounter for screening for respiratory tuberculosis: Secondary | ICD-10-CM

## 2015-12-21 NOTE — Progress Notes (Signed)

## 2015-12-25 ENCOUNTER — Telehealth: Payer: Self-pay

## 2015-12-25 NOTE — Telephone Encounter (Signed)
PT was scheduled to have TB test read//PT was scheduled to have TB reading on 12/24/15 when we were closed due to weather. She has been informed that OV must restart.

## 2015-12-26 ENCOUNTER — Ambulatory Visit: Payer: No Typology Code available for payment source

## 2015-12-26 DIAGNOSIS — Z111 Encounter for screening for respiratory tuberculosis: Secondary | ICD-10-CM

## 2015-12-28 ENCOUNTER — Ambulatory Visit (INDEPENDENT_AMBULATORY_CARE_PROVIDER_SITE_OTHER): Payer: No Typology Code available for payment source | Admitting: *Deleted

## 2015-12-28 DIAGNOSIS — Z7689 Persons encountering health services in other specified circumstances: Secondary | ICD-10-CM

## 2015-12-28 DIAGNOSIS — Z111 Encounter for screening for respiratory tuberculosis: Secondary | ICD-10-CM

## 2015-12-28 LAB — TB SKIN TEST
INDURATION: 0 mm
Induration: 0 mm
TB Skin Test: NEGATIVE
TB Skin Test: NEGATIVE

## 2016-01-16 ENCOUNTER — Ambulatory Visit (INDEPENDENT_AMBULATORY_CARE_PROVIDER_SITE_OTHER): Payer: No Typology Code available for payment source | Admitting: Family Medicine

## 2016-01-16 VITALS — BP 100/62 | HR 90 | Temp 98.4°F | Resp 18 | Ht 60.0 in | Wt 96.2 lb

## 2016-01-16 DIAGNOSIS — Z111 Encounter for screening for respiratory tuberculosis: Secondary | ICD-10-CM

## 2016-01-16 NOTE — Progress Notes (Signed)
Error- pt did not need visit, just PPD

## 2016-01-19 ENCOUNTER — Ambulatory Visit (INDEPENDENT_AMBULATORY_CARE_PROVIDER_SITE_OTHER): Payer: No Typology Code available for payment source

## 2016-01-19 DIAGNOSIS — Z111 Encounter for screening for respiratory tuberculosis: Secondary | ICD-10-CM

## 2016-01-19 LAB — TB SKIN TEST
Induration: 0 mm
TB SKIN TEST: NEGATIVE

## 2016-01-19 NOTE — Progress Notes (Signed)
   Subjective:    Patient ID: Elaine Hubbard, female    DOB: September 21, 1991, 25 y.o.   MRN: [id number]  HPI Pt. Came in for a TB read only. Results negative. Induration 0.72mm. Patient was given a copy of her results.     Review of Systems     Objective:   Physical Exam        Assessment & Plan:

## 2016-02-15 ENCOUNTER — Ambulatory Visit (INDEPENDENT_AMBULATORY_CARE_PROVIDER_SITE_OTHER): Payer: No Typology Code available for payment source | Admitting: Physician Assistant

## 2016-02-15 VITALS — BP 108/64 | HR 80 | Temp 98.4°F | Resp 17 | Ht 60.0 in | Wt 101.0 lb

## 2016-02-15 DIAGNOSIS — Z111 Encounter for screening for respiratory tuberculosis: Secondary | ICD-10-CM | POA: Diagnosis not present

## 2016-02-15 NOTE — Progress Notes (Signed)
   02/15/2016 4:52 PM   DOB: [birth year] / MRN: [id number]  SUBJECTIVE:  Elaine Hubbard is a 25 y.o. female presenting for a TB screen.  Reports she would like the TB gold test because she continues to "mess up" the TST process for her schooling.  She feels well today and has never screened positive for TB and denies cough, night sweats, and unintentional weight loss.    She has No Known Allergies.   She  has no past medical history on file.    She  reports that she has never smoked. She has never used smokeless tobacco. She reports that she does not drink alcohol or use illicit drugs. She  reports that she currently engages in sexual activity. She reports using the following method of birth control/protection: Condom. The patient  has no past surgical history on file.  Her family history is not on file.  ROS  Per HPI  Problem list and medications reviewed and updated by myself where necessary, and exist elsewhere in the encounter.   OBJECTIVE:  BP 108/64 mmHg  Pulse 80  Temp(Src) 98.4 F (36.9 C) (Oral)  Resp 17  Ht 5' (1.524 m)  Wt 101 lb (45.813 kg)  BMI 19.73 kg/m2  SpO2 98%  LMP 02/14/2016  Physical Exam  Constitutional: She appears well-developed and well-nourished. No distress.  Skin: She is not diaphoretic.  Vitals reviewed.   No results found for this or any previous visit (from the past 72 hour(s)).  No results found.  ASSESSMENT AND PLAN  Kayloni was seen today for immunizations.  Diagnoses and all orders for this visit:  Screening-pulmonary TB -     Quantiferon tb gold assay   The patient was advised to call or return to clinic if she does not see an improvement in symptoms or to seek the care of the closest emergency department if she worsens with the above plan.   Deliah Boston, MHS, PA-C [facility] 02/15/2016 4:52 PM

## 2016-02-15 NOTE — Progress Notes (Signed)

## 2016-02-15 NOTE — Patient Instructions (Signed)

## 2016-02-17 LAB — QUANTIFERON TB GOLD ASSAY (BLOOD)
Interferon Gamma Release Assay: NEGATIVE
QUANTIFERON TB AG MINUS NIL: 0.01 [IU]/mL
Quantiferon Nil Value: 0.04 IU/mL

## 2018-05-02 ENCOUNTER — Encounter (HOSPITAL_COMMUNITY): Payer: Self-pay | Admitting: Emergency Medicine

## 2018-05-02 ENCOUNTER — Ambulatory Visit (HOSPITAL_COMMUNITY)
Admission: EM | Admit: 2018-05-02 | Discharge: 2018-05-02 | Disposition: A | Discharge status: Home / Self Care | Payer: Self-pay | Attending: Internal Medicine | Admitting: Internal Medicine

## 2018-05-02 DIAGNOSIS — N898 Other specified noninflammatory disorders of vagina: Secondary | ICD-10-CM | POA: Insufficient documentation

## 2018-05-02 LAB — POCT URINALYSIS DIP (DEVICE)
BILIRUBIN URINE: NEGATIVE
GLUCOSE, UA: 100 mg/dL — AB
KETONES UR: NEGATIVE mg/dL
NITRITE: NEGATIVE
Protein, ur: 30 mg/dL — AB
Specific Gravity, Urine: 1.025 (ref 1.005–1.030)
Urobilinogen, UA: 0.2 mg/dL (ref 0.0–1.0)
pH: 7 (ref 5.0–8.0)

## 2018-05-02 LAB — POCT PREGNANCY, URINE: Preg Test, Ur: NEGATIVE

## 2018-05-02 MED ORDER — FLUCONAZOLE 150 MG PO TABS: 150.0000 mg | ORAL_TABLET | Freq: Once | ORAL | 0 refills | Status: AC

## 2018-05-02 NOTE — ED Provider Notes (Signed)
[facility]    CSN: [id number] Arrival date & time: 05/02/18  1354     History   Chief Complaint Chief Complaint  Patient presents with  . Vaginal Discharge    HPI Elaine Hubbard is a 27 y.o. female presenting today for vaginal discharge,.  She has had vaginal discharge for 1 week.  Associated with itching.  Has tried to drink water to flush it out but this is not helped.  Last menstrual period was around 5/1.  Not on any form of birth control.  Does note history of yeast infections, has had BV before but less frequent.  Denies any dysuria, does note some increased frequency.  HPI  History reviewed. No pertinent past medical history.  There are no active problems to display for this patient.   History reviewed. No pertinent surgical history.  OB History   None      Home Medications    Prior to Admission medications   Medication Sig Start Date End Date Taking? Authorizing Provider  fluconazole (DIFLUCAN) 150 MG tablet Take 1 tablet (150 mg total) by mouth once for 1 dose. 05/02/18 05/02/18  Marshun Duva, Junius Creamer, PA-C    Family History History reviewed. No pertinent family history.  Social History Social History   Tobacco Use  . Smoking status: Never Smoker  . Smokeless tobacco: Never Used  Substance Use Topics  . Alcohol use: No    Alcohol/week: 0.0 oz  . Drug use: No     Allergies   Patient has no known allergies.   Review of Systems Review of Systems  Constitutional: Negative for fever.  Respiratory: Negative for shortness of breath.   Cardiovascular: Negative for chest pain.  Gastrointestinal: Negative for abdominal pain, diarrhea, nausea and vomiting.  Genitourinary: Positive for frequency and vaginal discharge. Negative for dysuria, flank pain, genital sores, hematuria, menstrual problem, vaginal bleeding and vaginal pain.  Musculoskeletal: Negative for back pain.  Skin: Negative for rash.  Neurological: Negative for dizziness,  light-headedness and headaches.     Physical Exam Triage Vital Signs ED Triage Vitals [05/02/18 1536]  Enc Vitals Group     BP 131/75     Pulse Rate 92     Resp 18     Temp 98.9 F (37.2 C)     Temp Source Oral     SpO2 100 %     Weight      Height      Head Circumference      Peak Flow      Pain Score      Pain Loc      Pain Edu?      Excl. in GC?    No data found.  Updated Vital Signs BP 131/75 (BP Location: Right Arm)   Pulse 92   Temp 98.9 F (37.2 C) (Oral)   Resp 18   SpO2 100%   Visual Acuity Right Eye Distance:   Left Eye Distance:   Bilateral Distance:    Right Eye Near:   Left Eye Near:    Bilateral Near:     Physical Exam  Constitutional: She appears well-developed and well-nourished. No distress.  HENT:  Head: Normocephalic and atraumatic.  Eyes: Conjunctivae are normal.  Neck: Neck supple.  Cardiovascular: Normal rate and regular rhythm.  No murmur heard. Pulmonary/Chest: Effort normal and breath sounds normal. No respiratory distress.  Abdominal: Soft. There is no tenderness.  Genitourinary:  Genitourinary Comments: Deferred  Musculoskeletal: She exhibits no edema.  Neurological: She is alert.  Skin: Skin is warm and dry.  Psychiatric: She has a normal mood and affect.  Nursing note and vitals reviewed.    UC Treatments / Results  Labs (all labs ordered are listed, but only abnormal results are displayed) Labs Reviewed  POCT URINALYSIS DIP (DEVICE) - Abnormal; Notable for the following components:      Result Value   Glucose, UA 100 (*)    Hgb urine dipstick TRACE (*)    Protein, ur 30 (*)    Leukocytes, UA LARGE (*)    All other components within normal limits  URINE CULTURE  POCT PREGNANCY, URINE  CERVICOVAGINAL ANCILLARY ONLY    EKG None  Radiology No results found.  Procedures Procedures (including critical care time)  Medications Ordered in UC Medications - No data to display  Initial Impression / Assessment  and Plan / UC Course  I have reviewed the triage vital signs and the nursing notes.  Pertinent labs & imaging results that were available during my care of the patient were reviewed by me and considered in my medical decision making (see chart for details).     Large leuks on UA, will send for culture.  Vaginal swab obtained to check for yeast/BV and STDs.  We will go ahead and initiate treatment for yeast with Diflucan.  Patient also with glucose in urine, discussed getting evaluated for diabetes.  This is likely the cause of the increased frequency. Discussed strict return precautions. Patient verbalized understanding and is agreeable with plan.  Final Clinical Impressions(s) / UC Diagnoses   Final diagnoses:  Vaginal discharge     Discharge Instructions     Please take 1 tablet of diflucan, this will treat a yeast infection. May repeat in 3 days if symptoms still persisting.  We are testing you for Gonorrhea, Chlamydia, Trichomonas, Yeast and Bacterial Vaginosis. We will call you if anything is positive and let you know if you require any further treatment. Please inform partners of any positive results.   Please return if symptoms not improving with treatment, development of fever, nausea, vomiting, abdominal pain.     ED Prescriptions    Medication Sig Dispense Auth. Provider   fluconazole (DIFLUCAN) 150 MG tablet Take 1 tablet (150 mg total) by mouth once for 1 dose. 2 tablet Jaquanna Ballentine C, PA-C     Controlled Substance Prescriptions [place] Controlled Substance Registry consulted? Not Applicable   Lew Dawes, [place] 05/02/18 1746

## 2018-05-02 NOTE — Discharge Instructions (Signed)
Please take 1 tablet of diflucan, this will treat a yeast infection. May repeat in 3 days if symptoms still persisting.  We are testing you for Gonorrhea, Chlamydia, Trichomonas, Yeast and Bacterial Vaginosis. We will call you if anything is positive and let you know if you require any further treatment. Please inform partners of any positive results.   Please return if symptoms not improving with treatment, development of fever, nausea, vomiting, abdominal pain.

## 2018-05-02 NOTE — ED Triage Notes (Signed)
Pt sts she thinks she has a yeast infection

## 2018-05-03 LAB — URINE CULTURE

## 2018-05-04 ENCOUNTER — Telehealth (HOSPITAL_COMMUNITY): Payer: Self-pay

## 2018-05-04 LAB — CERVICOVAGINAL ANCILLARY ONLY
Bacterial vaginitis: NEGATIVE
CANDIDA VAGINITIS: POSITIVE — AB
CHLAMYDIA, DNA PROBE: NEGATIVE
NEISSERIA GONORRHEA: NEGATIVE
Trichomonas: NEGATIVE

## 2018-05-04 NOTE — Telephone Encounter (Signed)
Urine culture being positive for lactobacillus germ, which is part of the normal urogenital germ flora and does not represent a UTI. Attempted to reach patient, number provided was not correct.

## 2019-01-22 ENCOUNTER — Ambulatory Visit (HOSPITAL_COMMUNITY)
Admission: EM | Admit: 2019-01-22 | Discharge: 2019-01-22 | Disposition: A | Discharge status: Home / Self Care | Payer: 59 | Attending: Internal Medicine | Admitting: Internal Medicine

## 2019-01-22 ENCOUNTER — Encounter (HOSPITAL_COMMUNITY): Payer: Self-pay | Admitting: Emergency Medicine

## 2019-01-22 DIAGNOSIS — R300 Dysuria: Secondary | ICD-10-CM | POA: Diagnosis not present

## 2019-01-22 LAB — POCT URINALYSIS DIP (DEVICE)
Bilirubin Urine: NEGATIVE
Glucose, UA: NEGATIVE mg/dL
Leukocytes, UA: NEGATIVE
Nitrite: POSITIVE — AB
PROTEIN: 30 mg/dL — AB
UROBILINOGEN UA: 0.2 mg/dL (ref 0.0–1.0)
pH: 5.5 (ref 5.0–8.0)

## 2019-01-22 NOTE — ED Triage Notes (Signed)
Here for UTI sx... see MD's notes

## 2019-01-22 NOTE — ED Provider Notes (Signed)
[facility]    CSN: [id number] Arrival date & time: 01/22/19  0807     History   Chief Complaint Chief Complaint  Patient presents with  . Urinary Tract Infection    HPI Elaine Hubbard is a 28 y.o. female with no past medical history comes to the urgent care department on account of dysuria of 2 days duration.  Patient admits to having frequency and dysuria 2 days duration.  No flank pain no fever or chills.  Patient denies any abdominal pain.  She is currently having her menses.  She normally has a 30-day menstrual cycle but this is the second menstrual flow she has had in 1 month.  She admits to deep dyspareunia.  HPI  History reviewed. No pertinent past medical history.  There are no active problems to display for this patient.   History reviewed. No pertinent surgical history.  OB History   No obstetric history on file.      Home Medications    Prior to Admission medications   Not on File    Family History History reviewed. No pertinent family history.  Social History Social History   Tobacco Use  . Smoking status: Never Smoker  . Smokeless tobacco: Never Used  Substance Use Topics  . Alcohol use: No    Alcohol/week: 0.0 standard drinks  . Drug use: No     Allergies   Patient has no known allergies.   Review of Systems Review of Systems  Constitutional: Negative for activity change and appetite change.  Gastrointestinal: Negative for diarrhea and nausea.  Endocrine: Negative for cold intolerance and heat intolerance.  Genitourinary: Positive for difficulty urinating, dyspareunia, dysuria, frequency, urgency and vaginal bleeding. Negative for decreased urine volume and enuresis.  Musculoskeletal: Negative for back pain.  Neurological: Negative for dizziness and light-headedness.     Physical Exam Triage Vital Signs ED Triage Vitals  Enc Vitals Group     BP 01/22/19 0827 111/75     Pulse Rate 01/22/19 0827 74     Resp 01/22/19  0827 16     Temp 01/22/19 0827 97.9 F (36.6 C)     Temp Source 01/22/19 0827 Oral     SpO2 01/22/19 0827 99 %     Weight --      Height --      Head Circumference --      Peak Flow --      Pain Score 01/22/19 0842 5     Pain Loc --      Pain Edu? --      Excl. in GC? --    No data found.  Updated Vital Signs BP 111/75 (BP Location: Left Arm)   Pulse 74   Temp 97.9 F (36.6 C) (Oral)   Resp 16   LMP 01/21/2019   SpO2 99%   Visual Acuity Right Eye Distance:   Left Eye Distance:   Bilateral Distance:    Right Eye Near:   Left Eye Near:    Bilateral Near:     Physical Exam Constitutional:      Appearance: Normal appearance. She is not ill-appearing.  HENT:     Right Ear: Tympanic membrane normal.     Left Ear: Tympanic membrane normal.     Nose: Nose normal.  Pulmonary:     Effort: Pulmonary effort is normal.     Breath sounds: Normal breath sounds.  Abdominal:     General: Bowel sounds are normal. There is no  distension.     Palpations: There is no mass.     Tenderness: There is no abdominal tenderness. There is no right CVA tenderness, left CVA tenderness or guarding.  Musculoskeletal: Normal range of motion.  Skin:    General: Skin is dry.     Capillary Refill: Capillary refill takes less than 2 seconds.  Neurological:     Mental Status: She is alert.      UC Treatments / Results  Labs (all labs ordered are listed, but only abnormal results are displayed) Labs Reviewed  POCT URINALYSIS DIP (DEVICE) - Abnormal; Notable for the following components:      Result Value   Ketones, ur TRACE (*)    Hgb urine dipstick LARGE (*)    Protein, ur 30 (*)    Nitrite POSITIVE (*)    All other components within normal limits  URINE CULTURE    EKG None  Radiology No results found.  Procedures Procedures (including critical care time)  Medications Ordered in UC Medications - No data to display  Initial Impression / Assessment and Plan / UC Course  I  have reviewed the triage vital signs and the nursing notes.  Pertinent labs & imaging results that were available during my care of the patient were reviewed by me and considered in my medical decision making (see chart for details).     1.  Dysuria during menses: Urinalysis done and reviewed by me shows nitrite, blood, negative leukocytes Urine culture sent  If urine cultures has significant growth, patient will be called and antibiotics may be called in. Patient is encouraged to increase oral fluid intake given the fact that she had ketones in the urine specific gravity was greater than 1.030.  2.  Metrorrhagia: Referral to OB/GYN  Final Clinical Impressions(s) / UC Diagnoses   Final diagnoses:  Dysuria   Discharge Instructions   None    ED Prescriptions    None     Controlled Substance Prescriptions [place] Controlled Substance Registry consulted? No   Merrilee Jansky, MD 01/22/19 [phone]

## 2019-01-24 LAB — URINE CULTURE
Culture: >=100000 — AB
SPECIAL REQUESTS: NORMAL

## 2019-01-26 ENCOUNTER — Telehealth (HOSPITAL_COMMUNITY): Payer: Self-pay | Admitting: Emergency Medicine

## 2019-01-26 MED ORDER — NITROFURANTOIN MONOHYD MACRO 100 MG PO CAPS: 100.0000 mg | ORAL_CAPSULE | Freq: Two times a day (BID) | ORAL | 0 refills | Status: DC

## 2019-01-26 NOTE — Telephone Encounter (Signed)
Sent macrobid to patients pharmacy, Patient contacted and made aware of all results, all questions answered.

## 2019-02-25 ENCOUNTER — Other Ambulatory Visit: Payer: Self-pay

## 2019-02-25 ENCOUNTER — Encounter: Payer: Self-pay | Admitting: Medical

## 2019-02-25 ENCOUNTER — Ambulatory Visit (INDEPENDENT_AMBULATORY_CARE_PROVIDER_SITE_OTHER): Payer: 59 | Admitting: Medical

## 2019-02-25 VITALS — BP 100/60 | HR 76 | Temp 98.3°F | Resp 16 | Ht 61.0 in | Wt 102.4 lb

## 2019-02-25 DIAGNOSIS — R41840 Attention and concentration deficit: Secondary | ICD-10-CM | POA: Diagnosis not present

## 2019-02-25 DIAGNOSIS — F69 Unspecified disorder of adult personality and behavior: Secondary | ICD-10-CM | POA: Diagnosis not present

## 2019-02-25 NOTE — Patient Instructions (Signed)
RESOURCES in [place], [place]   [facility] [address] [place], [place] [address] [phone]   [facility] [phone] [address], [place], [place] [address]   [facility] [phone]  [email or web address] [address] [place], [place], [place] [address]

## 2019-02-25 NOTE — Progress Notes (Signed)
Subjective:     Patient ID: Elaine Hubbard, female   DOB: [birth year], 28 y.o.   MRN: [id number]  HPI Chief Complaint  Patient presents with  . NP    NP trouble focusing, recent travel [place]   Here as a new patient for trouble focusing of late.  She has never seen a counselor or psychiatrist or psychologist for any mental health concerns in the past.  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  She has been in the same position since starting went home, and her day-to-day routine at work is pretty consistent.  She denies a lot of stressors in her life.  She lives alone.  No significant other right now.  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  First started having problems with focus few years ago, but felt like she should come get it checked out.  The issues is worse of late.  Makes to do lists, has trouble getting things done, at work will waste time from one task to another, scattered brain, without getting a task done.   Not a worrier.    She is a happy person.   No mood swings.  No depression.  Wonders about ADD.  Had trouble in college with getting work done, made mostly Bs.  High school grades B, Cs.   First thoughts of focus problems were end of high school.      Exercise - not much.   No particular diet discretion.  No drugs, no alcohol, non smoker.     No family history of mental health problems.    No prior trouble with the legal issues.     She traveled to [place] about a month ago.  She has no illness symptoms today.  Review of Systems As in subjective     Objective:   Physical Exam  BP 100/60   Pulse 76   Temp 98.3 F (36.8 C) (Oral)   Resp 16   Ht 5\' 1"  (1.549 m)   Wt 102 lb 6.4 oz (46.4 kg)   LMP 02/15/2019 (Exact Date)   SpO2 98%   BMI 19.35 kg/m     General appearance: alert, no distress, WD/WN,  Neck: supple, no lymphadenopathy, no  thyromegaly, no masses Heart: RRR, normal S1, S2, no murmurs Extremities: no edema, no cyanosis, no clubbing Pulses: 2+ symmetric, upper and lower extremities, normal cap refill Neurological: alert, oriented x 3, CN2-12 intact, strength normal upper extremities and lower extremities, sensation normal throughout, DTRs 2+ throughout, no cerebellar signs, gait normal Psychiatric: normal affect, behavior normal, pleasant      Assessment:     Encounter Diagnoses  Name Primary?  . Attention deficit Yes  . Behavior concern in adult        Plan:     I reviewed her normal PHQ 9, reviewed her ADHD questionnaire.  We discussed her concerns, possible contributing factors, discussed her priorities.  I advise she reduce distractions were possible, have a fairly routine daily schedule.  Advised avoiding sugary foods and alcohol.    Advise she have a formal evaluation with a psychologist for testing to rule out other issues.  She likely has attention deficit disorder compared to other less likely diagnoses.    We discussed the need for counseling, discussed that not all people with ADD need to be on medication.  She seems otherwise healthy.  Advise she follow-up after the psychological evaluation

## 2019-07-20 DIAGNOSIS — Z118 Encounter for screening for other infectious and parasitic diseases: Secondary | ICD-10-CM | POA: Diagnosis not present

## 2019-07-20 DIAGNOSIS — Z1159 Encounter for screening for other viral diseases: Secondary | ICD-10-CM | POA: Diagnosis not present

## 2019-07-20 DIAGNOSIS — Z01419 Encounter for gynecological examination (general) (routine) without abnormal findings: Secondary | ICD-10-CM | POA: Diagnosis not present

## 2019-07-20 DIAGNOSIS — B373 Candidiasis of vulva and vagina: Secondary | ICD-10-CM | POA: Diagnosis not present

## 2019-07-20 DIAGNOSIS — Z681 Body mass index (BMI) 19 or less, adult: Secondary | ICD-10-CM | POA: Diagnosis not present

## 2019-07-20 DIAGNOSIS — Z113 Encounter for screening for infections with a predominantly sexual mode of transmission: Secondary | ICD-10-CM | POA: Diagnosis not present

## 2019-07-20 DIAGNOSIS — Z114 Encounter for screening for human immunodeficiency virus [HIV]: Secondary | ICD-10-CM | POA: Diagnosis not present

## 2019-07-20 DIAGNOSIS — N898 Other specified noninflammatory disorders of vagina: Secondary | ICD-10-CM | POA: Diagnosis not present

## 2020-02-10 ENCOUNTER — Other Ambulatory Visit: Payer: Self-pay

## 2020-02-10 ENCOUNTER — Emergency Department (HOSPITAL_COMMUNITY)
Admission: EM | Admit: 2020-02-10 | Discharge: 2020-02-10 | Disposition: A | Discharge status: Home / Self Care | Payer: 59 | Attending: Emergency Medicine | Admitting: Emergency Medicine

## 2020-02-10 ENCOUNTER — Encounter (HOSPITAL_COMMUNITY): Payer: Self-pay | Admitting: Emergency Medicine

## 2020-02-10 ENCOUNTER — Emergency Department (HOSPITAL_COMMUNITY): Payer: 59

## 2020-02-10 DIAGNOSIS — R0789 Other chest pain: Secondary | ICD-10-CM | POA: Diagnosis not present

## 2020-02-10 DIAGNOSIS — M25512 Pain in left shoulder: Secondary | ICD-10-CM | POA: Insufficient documentation

## 2020-02-10 DIAGNOSIS — R0602 Shortness of breath: Secondary | ICD-10-CM | POA: Diagnosis not present

## 2020-02-10 DIAGNOSIS — R002 Palpitations: Secondary | ICD-10-CM | POA: Diagnosis not present

## 2020-02-10 DIAGNOSIS — E0590 Thyrotoxicosis, unspecified without thyrotoxic crisis or storm: Secondary | ICD-10-CM | POA: Diagnosis not present

## 2020-02-10 DIAGNOSIS — R05 Cough: Secondary | ICD-10-CM | POA: Diagnosis not present

## 2020-02-10 DIAGNOSIS — R079 Chest pain, unspecified: Secondary | ICD-10-CM | POA: Diagnosis not present

## 2020-02-10 DIAGNOSIS — R0981 Nasal congestion: Secondary | ICD-10-CM | POA: Insufficient documentation

## 2020-02-10 LAB — CBC
HCT: 38.9 % (ref 36.0–46.0)
Hemoglobin: 12.2 g/dL (ref 12.0–15.0)
MCH: 27.3 pg (ref 26.0–34.0)
MCHC: 31.4 g/dL (ref 30.0–36.0)
MCV: 87 fL (ref 80.0–100.0)
Platelets: 238 10*3/uL (ref 150–400)
RBC: 4.47 MIL/uL (ref 3.87–5.11)
RDW: 12.8 % (ref 11.5–15.5)
WBC: 3.9 10*3/uL — ABNORMAL LOW (ref 4.0–10.5)
nRBC: 0 % (ref 0.0–0.2)

## 2020-02-10 LAB — BASIC METABOLIC PANEL
Anion gap: 11 (ref 5–15)
BUN: 7 mg/dL (ref 6–20)
CO2: 23 mmol/L (ref 22–32)
Calcium: 9.2 mg/dL (ref 8.9–10.3)
Chloride: 104 mmol/L (ref 98–111)
Creatinine, Ser: 0.69 mg/dL (ref 0.44–1.00)
GFR calc Af Amer: >60 mL/min (ref >60)
GFR calc non Af Amer: >60 mL/min (ref >60)
Glucose, Bld: 148 mg/dL — ABNORMAL HIGH (ref 70–99)
Potassium: 3.7 mmol/L (ref 3.5–5.1)
Sodium: 138 mmol/L (ref 135–145)

## 2020-02-10 LAB — TSH: TSH: 0.316 u[IU]/mL — ABNORMAL LOW (ref 0.350–4.500)

## 2020-02-10 LAB — TROPONIN I (HIGH SENSITIVITY)
Troponin I (High Sensitivity): <2 ng/L (ref <18)
Troponin I (High Sensitivity): <2 ng/L (ref <18)

## 2020-02-10 LAB — D-DIMER, QUANTITATIVE: D-Dimer, Quant: 0.63 ug/mL-FEU — ABNORMAL HIGH (ref 0.00–0.50)

## 2020-02-10 LAB — I-STAT BETA HCG BLOOD, ED (MC, WL, AP ONLY): I-stat hCG, quantitative: <5 m[IU]/mL (ref <5)

## 2020-02-10 MED ORDER — IOHEXOL 350 MG/ML SOLN
75.0000 mL | Freq: Once | INTRAVENOUS | Status: AC | PRN
  Administered 2020-02-10: 75 mL via INTRAVENOUS

## 2020-02-10 MED ORDER — SODIUM CHLORIDE 0.9% FLUSH: 3.0000 mL | Freq: Once | INTRAVENOUS | Status: DC

## 2020-02-10 NOTE — ED Provider Notes (Signed)
[facility] EMERGENCY DEPARTMENT Provider Note   CSN: [id number] Arrival date & time: 02/10/20  1356     History Chief Complaint  Patient presents with  . Chest Pain    Elaine Hubbard is a 29 y.o. female presenting for evaluation of chest pain and palpitations.  Patient states for the past month, she has been having intermittent chest pain.  When it occurs, chest pain is on the left side.  It is sharp and lasting several minutes before resolving without intervention.  There is no known she can do to bring the pain on, nothing that she can do to make it go away.  Today, she also developed left shoulder pain, chills or prompted her ER visit.  Patient reports intermittent palpitations.  She states over the past several days, she has felt more short of breath with exertion.  No shortness of breath at rest.  She denies previous history of heart problems.  Patient states the past few days, she has also had mild cough and nasal congestion.  She was tested for Covid just prior to her arrival in the ED, results are pending.  She denies fevers, chills, current shortness of breath, nausea, vomiting, dumping, urinary symptoms, normal bowel movements.  Patient states she recently traveled to [place], flew home 4 days ago.  This is not greater than 4 hours of travel.  She denies leg pain or swelling.  She denies recent trauma, surgeries, immobilization, history of cancer, history of previous DVT/PE, or hormone use.  She has no medical problems, takes no medications daily.  HPI     History reviewed. No pertinent past medical history.  Patient Active Problem List   Diagnosis Date Noted  . Attention deficit 02/25/2019  . Behavior concern in adult 02/25/2019    History reviewed. No pertinent surgical history.   OB History   No obstetric history on file.     History reviewed. No pertinent family history.  Social History   Tobacco Use  . Smoking status: Never Smoker  .  Smokeless tobacco: Never Used  Substance Use Topics  . Alcohol use: No    Alcohol/week: 0.0 standard drinks  . Drug use: No    Home Medications Prior to Admission medications   Medication Sig Start Date End Date Taking? Authorizing Provider  ibuprofen (ADVIL) 200 MG tablet Take 400 mg by mouth every 6 (six) hours as needed for mild pain or moderate pain.   Yes [provider]    Allergies    Patient has no known allergies.  Review of Systems   Review of Systems  Respiratory: Positive for shortness of breath (with exertion).   Cardiovascular: Positive for chest pain and palpitations.  All other systems reviewed and are negative.   Physical Exam Updated Vital Signs BP 118/81   Pulse 94   Temp 98.9 F (37.2 C) (Oral)   Resp (!) 22   Wt 46.7 kg   SpO2 99%   BMI 19.46 kg/m   Physical Exam Vitals and nursing note reviewed.  Constitutional:      General: She is not in acute distress.    Appearance: She is well-developed.     Comments: Appears nontoxic  HENT:     Head: Normocephalic and atraumatic.  Eyes:     Conjunctiva/sclera: Conjunctivae normal.     Pupils: Pupils are equal, round, and reactive to light.  Cardiovascular:     Rate and Rhythm: Regular rhythm. Tachycardia present.     Pulses:  Normal pulses.     Comments: Heart rate variable, between 95 and 115 on my exam. Pulmonary:     Effort: Pulmonary effort is normal. No respiratory distress.     Breath sounds: Normal breath sounds. No wheezing.     Comments: Speaking in full sentences, clear lung sounds in all fields.  No signs of respiratory distress. Abdominal:     General: There is no distension.     Palpations: Abdomen is soft. There is no mass.     Tenderness: There is no abdominal tenderness. There is no guarding or rebound.  Musculoskeletal:        General: Normal range of motion.     Cervical back: Normal range of motion and neck supple.     Right lower leg: No edema.     Left lower leg: No  edema.     Comments: No leg pain or swelling  Skin:    General: Skin is warm and dry.     Capillary Refill: Capillary refill takes less than 2 seconds.  Neurological:     Mental Status: She is alert and oriented to person, place, and time.     ED Results / Procedures / Treatments   Labs (all labs ordered are listed, but only abnormal results are displayed) Labs Reviewed  BASIC METABOLIC PANEL - Abnormal; Notable for the following components:      Result Value   Glucose, Bld 148 (*)    All other components within normal limits  CBC - Abnormal; Notable for the following components:   WBC 3.9 (*)    All other components within normal limits  D-DIMER, QUANTITATIVE (NOT AT [facility]) - Abnormal; Notable for the following components:   D-Dimer, Quant 0.63 (*)    All other components within normal limits  TSH - Abnormal; Notable for the following components:   TSH 0.316 (*)    All other components within normal limits  I-STAT BETA HCG BLOOD, ED ([facility], [facility], [facility] ONLY)  TROPONIN I (HIGH SENSITIVITY)  TROPONIN I (HIGH SENSITIVITY)    EKG EKG Interpretation  Date/Time:  Thursday February 10 2020 13:59:47 EST Ventricular Rate:  118 PR Interval:  130 QRS Duration: 78 QT Interval:  302 QTC Calculation: 423 R Axis:   41 Text Interpretation: Sinus tachycardia Low voltage QRS T wave abnormality, consider inferior ischemia T wave abnormality, consider anterior ischemia Abnormal ECG No acute changes Nonspecific ST and T wave abnormality No old tracing to compare Confirmed by Derwood Kaplan [id number]) on 02/10/2020 5:29:44 PM   Radiology DG Chest 2 View  Result Date: 02/10/2020 CLINICAL DATA:  Chest pain EXAM: CHEST - 2 VIEW COMPARISON:  None. FINDINGS: Lungs are clear. Heart size and pulmonary vascularity are normal. No adenopathy. No pneumothorax. No bone lesions. IMPRESSION: No abnormality noted. Electronically Signed   By: Bretta Bang III M.D.   On: 02/10/2020 14:54     Procedures Procedures (including critical care time)  Medications Ordered in ED Medications  sodium chloride flush (NS) 0.9 % injection 3 mL (has no administration in time range)    ED Course  I have reviewed the triage vital signs and the nursing notes.  Pertinent labs & imaging results that were available during my care of the patient were reviewed by me and considered in my medical decision making (see chart for details).    MDM Rules/Calculators/A&P                      Patient  is in for evaluation of intermittent chest pain, palpitation, and current left shoulder pain.  Additionally, she is having some mild dyspnea on exertion.  On my exam, patient appears nontoxic.  However, she is intermittently tachycardic.  Recently traveled, although not for extended period of time.  Consider PE.  Patient also with a mild cough and nasal congestion, consider Covid.  Consider other viral illness.  Less likely cardiac cause for symptoms, however will obtain labs, EKG, and cxr to rule out cardiac injury.  Labs interpreted by me, overall reassuring.  Mild leukopenia at 3.9, likely viral.  Hemoglobin stable.  Electrolytes stable.  Initial troponin negative.  TSH is low at 0.316, consistent with hyper thyroid.  This could be contributing to patient's tachycardia palpitations.  Additionally, patient's D-dimer is mildly elevated at 0.63.  As such, will obtain CTA.  EKG shows some T wave abnormalities, but no STEMI.  Chest x-ray viewed interpreted by me, no pneumonia.  Postinfusion, cardiomegaly.  Repeat troponin negative.  CTA pending.  Pt signed out to Jasper Loser, PA-C for f/u on CTA.   Final Clinical Impression(s) / ED Diagnoses Final diagnoses:  None    Rx / DC Orders ED Discharge Orders    None       Franchot Heidelberg, PA-C 02/10/20 1914    Varney Biles, MD 02/11/20 0005

## 2020-02-10 NOTE — ED Provider Notes (Signed)
28yo female with possible COVID, labs pending. 1 month left side intermittent CP with palpitaitons, SHOB with exertion, returned from [place] Monday. No risk factors for PE, elevated dimer, awaiting CTA. Also TSH with hyperthyroid which may be causing her palpitations and tachycardia. May dc if CTA negative for PE.  Physical Exam  BP 109/79   Pulse 96   Temp 98.9 F (37.2 C) (Oral)   Resp 19   Wt 46.7 kg   SpO2 98%   BMI 19.46 kg/m   Physical Exam Appearing, no distress, respirations even and unlabored, normal sinus rhythm with a rate of 90. ED Course/Procedures     Procedures  MDM  CTA chest returns, negative for PE or any other acute pathology.  Discussed results with patient, she states health at work just called her to notify her of a positive Covid test.  Discussed patient's TSH results, she will follow-up with a PCP and return to the ER as needed.       Jeannie Fend, PA-C 02/10/20 1942    Eber Hong, MD 02/11/20 [phone]

## 2020-02-10 NOTE — Discharge Instructions (Addendum)
Call the number listed in the paperwork to help you set up primary care.  You will need to follow-up regarding your hyperthyroidism. Return to the emergency room with any new, worsening, concerning symptoms.

## 2020-02-10 NOTE — ED Triage Notes (Signed)
Pt arrives to ED from home with complaints of chest pain since the beginning of January. Patient comes to ED today due to the pain radiating to her left shoulder yesterday. Patient stated that her throat now hurts and she developed a dry cough yesterday,

## 2020-09-04 DIAGNOSIS — R350 Frequency of micturition: Secondary | ICD-10-CM | POA: Diagnosis not present

## 2020-09-04 DIAGNOSIS — B009 Herpesviral infection, unspecified: Secondary | ICD-10-CM | POA: Diagnosis not present

## 2021-10-07 ENCOUNTER — Emergency Department (HOSPITAL_COMMUNITY): Payer: Self-pay

## 2021-10-07 ENCOUNTER — Other Ambulatory Visit: Payer: Self-pay

## 2021-10-07 ENCOUNTER — Emergency Department (HOSPITAL_COMMUNITY)
Admission: EM | Admit: 2021-10-07 | Discharge: 2021-10-07 | Disposition: A | Discharge status: Home / Self Care | Payer: Self-pay | Attending: Emergency Medicine | Admitting: Emergency Medicine

## 2021-10-07 ENCOUNTER — Encounter (HOSPITAL_COMMUNITY): Payer: Self-pay

## 2021-10-07 DIAGNOSIS — F10129 Alcohol abuse with intoxication, unspecified: Secondary | ICD-10-CM | POA: Insufficient documentation

## 2021-10-07 DIAGNOSIS — F10920 Alcohol use, unspecified with intoxication, uncomplicated: Secondary | ICD-10-CM

## 2021-10-07 DIAGNOSIS — Y92001 Dining room of unspecified non-institutional (private) residence as the place of occurrence of the external cause: Secondary | ICD-10-CM | POA: Insufficient documentation

## 2021-10-07 DIAGNOSIS — D649 Anemia, unspecified: Secondary | ICD-10-CM | POA: Insufficient documentation

## 2021-10-07 DIAGNOSIS — Z79899 Other long term (current) drug therapy: Secondary | ICD-10-CM | POA: Insufficient documentation

## 2021-10-07 DIAGNOSIS — S0083XA Contusion of other part of head, initial encounter: Secondary | ICD-10-CM | POA: Insufficient documentation

## 2021-10-07 DIAGNOSIS — Y908 Blood alcohol level of 240 mg/100 ml or more: Secondary | ICD-10-CM | POA: Insufficient documentation

## 2021-10-07 DIAGNOSIS — W01198A Fall on same level from slipping, tripping and stumbling with subsequent striking against other object, initial encounter: Secondary | ICD-10-CM | POA: Insufficient documentation

## 2021-10-07 LAB — I-STAT BETA HCG BLOOD, ED (MC, WL, AP ONLY): I-stat hCG, quantitative: 218 m[IU]/mL — ABNORMAL HIGH (ref <5)

## 2021-10-07 LAB — COMPREHENSIVE METABOLIC PANEL
ALT: 23 U/L (ref 0–44)
AST: 26 U/L (ref 15–41)
Albumin: 4.7 g/dL (ref 3.5–5.0)
Alkaline Phosphatase: 44 U/L (ref 38–126)
Anion gap: 9 (ref 5–15)
BUN: 8 mg/dL (ref 6–20)
CO2: 24 mmol/L (ref 22–32)
Calcium: 9.1 mg/dL (ref 8.9–10.3)
Chloride: 108 mmol/L (ref 98–111)
Creatinine, Ser: 0.45 mg/dL (ref 0.44–1.00)
GFR, Estimated: >60 mL/min (ref >60)
Glucose, Bld: 125 mg/dL — ABNORMAL HIGH (ref 70–99)
Potassium: 3.5 mmol/L (ref 3.5–5.1)
Sodium: 141 mmol/L (ref 135–145)
Total Bilirubin: 0.5 mg/dL (ref 0.3–1.2)
Total Protein: 8.5 g/dL — ABNORMAL HIGH (ref 6.5–8.1)

## 2021-10-07 LAB — ETHANOL: Alcohol, Ethyl (B): 255 mg/dL — ABNORMAL HIGH (ref <10)

## 2021-10-07 LAB — CBC
HCT: 30.2 % — ABNORMAL LOW (ref 36.0–46.0)
Hemoglobin: 9.5 g/dL — ABNORMAL LOW (ref 12.0–15.0)
MCH: 27 pg (ref 26.0–34.0)
MCHC: 31.5 g/dL (ref 30.0–36.0)
MCV: 85.8 fL (ref 80.0–100.0)
Platelets: 364 10*3/uL (ref 150–400)
RBC: 3.52 MIL/uL — ABNORMAL LOW (ref 3.87–5.11)
RDW: 13.2 % (ref 11.5–15.5)
WBC: 8.6 10*3/uL (ref 4.0–10.5)
nRBC: 0 % (ref 0.0–0.2)

## 2021-10-07 NOTE — Discharge Instructions (Signed)
Apply ice for 30 minutes at a time, 4 times a day.  Take ibuprofen or acetaminophen as needed for pain.

## 2021-10-07 NOTE — ED Provider Notes (Signed)
[facility]-EMERGENCY DEPT Provider Note   CSN: [id number] Arrival date & time: 10/07/21  0110     History Chief Complaint  Patient presents with   Head Injury   Alcohol Intoxication    Elaine Hubbard is a 30 y.o. female.  The history is provided by the patient and a relative.  Head Injury Alcohol Intoxication She has history of attention deficit disorder and apparently had been drinking at home.  She is unable to tell me how much she had been drinking.  She apparently fell and hit her forehead.  She does not remember falling.  She denies other drug use.  Friend brought her in concerned about possible head injury.  Patient denies other drug use.  She denies nausea or vomiting.  She denies other injury.   History reviewed. No pertinent past medical history.  Patient Active Problem List   Diagnosis Date Noted   Attention deficit 02/25/2019   Behavior concern in adult 02/25/2019    History reviewed. No pertinent surgical history.   OB History   No obstetric history on file.     No family history on file.  Social History   Tobacco Use   Smoking status: Never   Smokeless tobacco: Never  Substance Use Topics   Alcohol use: No    Alcohol/week: 0.0 standard drinks   Drug use: No    Home Medications Prior to Admission medications   Medication Sig Start Date End Date Taking? Authorizing Provider  ibuprofen (ADVIL) 200 MG tablet Take 400 mg by mouth every 6 (six) hours as needed for mild pain or moderate pain.    [provider]    Allergies    Patient has no known allergies.  Review of Systems   Review of Systems  All other systems reviewed and are negative.  Physical Exam Updated Vital Signs BP 110/78 (BP Location: Left Arm)   Pulse 98   Temp (!) 97.4 F (36.3 C) (Oral)   Resp 16   Ht 5\' 1"  (1.549 m)   Wt 45.4 kg   SpO2 100%   BMI 18.89 kg/m   Physical Exam Vitals and nursing note reviewed.  30 year old female,  resting comfortably and in no acute distress. Vital signs are normal. Oxygen saturation is 100%, which is normal. Head is normocephalic.  Small hematoma present in the middle of the forehead. PERRLA, EOMI. Oropharynx is clear. Neck is nontender without adenopathy or JVD. Back is nontender and there is no CVA tenderness. Lungs are clear without rales, wheezes, or rhonchi. Chest is nontender. Heart has regular rate and rhythm without murmur. Abdomen is soft, flat, nontender. Extremities have no cyanosis or edema, full range of motion is present. Skin is warm and dry without rash. Neurologic: Drowsy but arousable, oriented to person and place and mostly oriented to time.  Cranial nerves are intact, moves all extremities equally.  ED Results / Procedures / Treatments   Labs (all labs ordered are listed, but only abnormal results are displayed) Labs Reviewed  CBC - Abnormal; Notable for the following components:      Result Value   RBC 3.52 (*)    Hemoglobin 9.5 (*)    HCT 30.2 (*)    All other components within normal limits  COMPREHENSIVE METABOLIC PANEL - Abnormal; Notable for the following components:   Glucose, Bld 125 (*)    Total Protein 8.5 (*)    All other components within normal limits  ETHANOL - Abnormal; Notable  for the following components:   Alcohol, Ethyl (B) 255 (*)    All other components within normal limits  I-STAT BETA HCG BLOOD, ED ([facility], [facility], [facility] ONLY)   Radiology CT Head Wo Contrast  Result Date: 10/07/2021 CLINICAL DATA:  Status post fall. EXAM: CT HEAD WITHOUT CONTRAST TECHNIQUE: Contiguous axial images were obtained from the base of the skull through the vertex without intravenous contrast. COMPARISON:  None. FINDINGS: Brain: No evidence of acute infarction, hemorrhage, hydrocephalus, extra-axial collection or mass lesion/mass effect. Vascular: No hyperdense vessel or unexpected calcification. Skull: Normal. Negative for fracture or focal lesion. Sinuses/Orbits:  No acute finding. Other: None. IMPRESSION: No acute intracranial pathology. Electronically Signed   By: Aram Candela M.D.   On: 10/07/2021 02:23    Procedures Procedures   Medications Ordered in ED Medications - No data to display  ED Course  I have reviewed the triage vital signs and the nursing notes.  Pertinent labs & imaging results that were available during my care of the patient were reviewed by me and considered in my medical decision making (see chart for details).   MDM Rules/Calculators/A&P                         Fall in the setting of alcohol intoxication.  Forehead contusion noted.  In the setting of intoxication, cannot be sure that she has not had a significant closed head injury, will send for CT of head.  Old records are reviewed, and she has no relevant past visits.  Labs show anemia which is new compared with prior labs.  CT of head shows no intracranial injury.  Ethanol level is elevated consistent with alcohol intoxication.  She is discharged with instructions to return if new or concerning symptoms occur.  Final Clinical Impression(s) / ED Diagnoses Final diagnoses:  Contusion of forehead, initial encounter  Alcohol intoxication, uncomplicated (HCC)  Normochromic normocytic anemia    Rx / DC Orders ED Discharge Orders     None        Dione Booze, MD 10/07/21 [phone]

## 2021-10-07 NOTE — ED Triage Notes (Signed)
Pt was drinking at home this evening when she had a fall in her dining room striking her head on an unknown object. Pt denies any LOC. Arrives A+O, VSS, NADN.

## 2021-11-12 IMAGING — CT CT HEAD W/O CM
3 series · 16 of 47 positions shown, 19 images · non-contrast
Comparison: None.

CLINICAL DATA: Status post fall.

EXAM:
CT HEAD WITHOUT CONTRAST
TECHNIQUE: Contiguous axial images were obtained from the base of the skull
through the vertex without intravenous contrast.

[Series 2: head wo · axial · 0.43mm/px · z∈[-154,-29]mm · 10 of 31 slices shown, 13 images]
[im 3/31  brain]
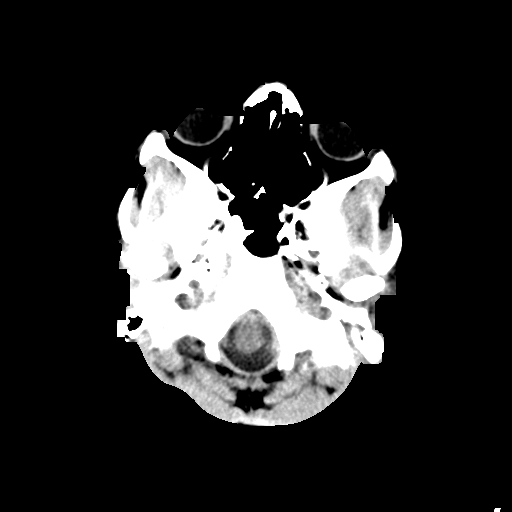
[im 3/31  bone]
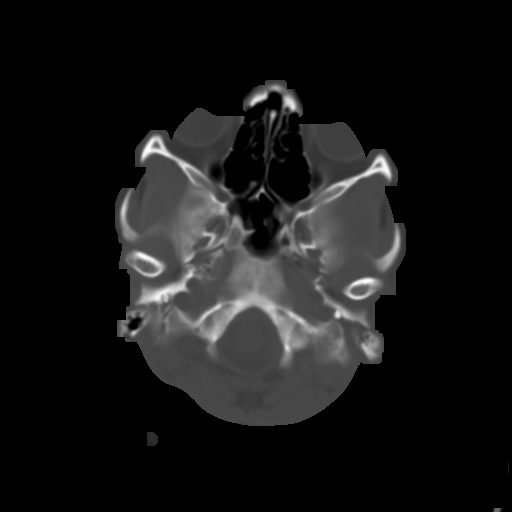
[im 6/31  brain]
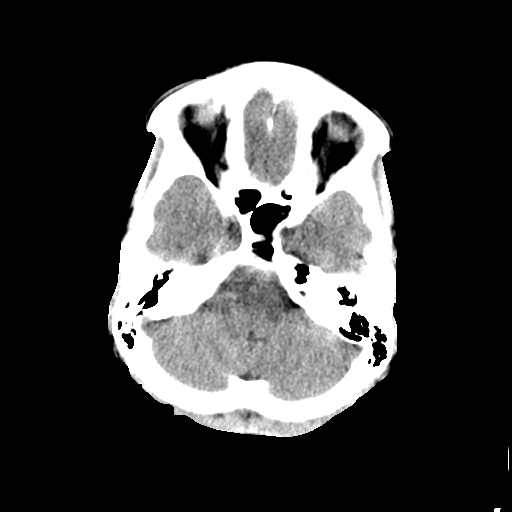
[im 9/31  brain]
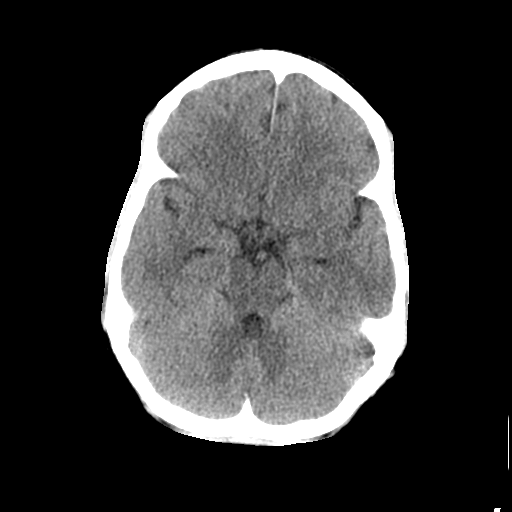
[im 11/31  brain]
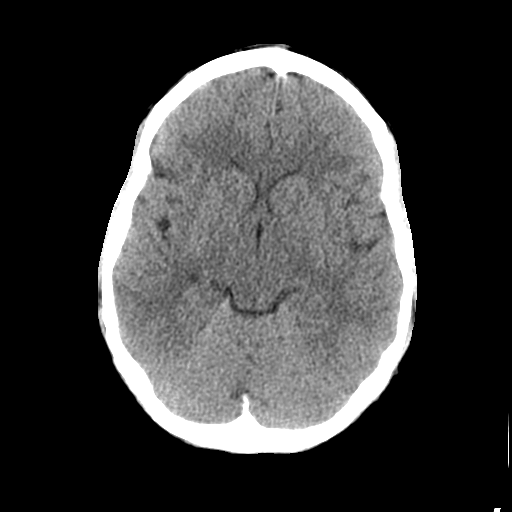
[im 14/31  brain]
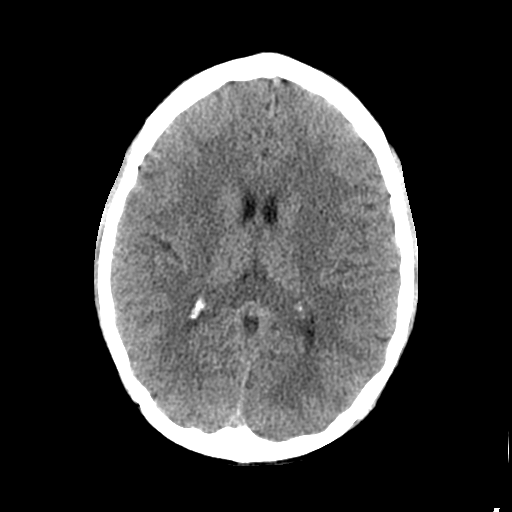
[im 14/31  bone]
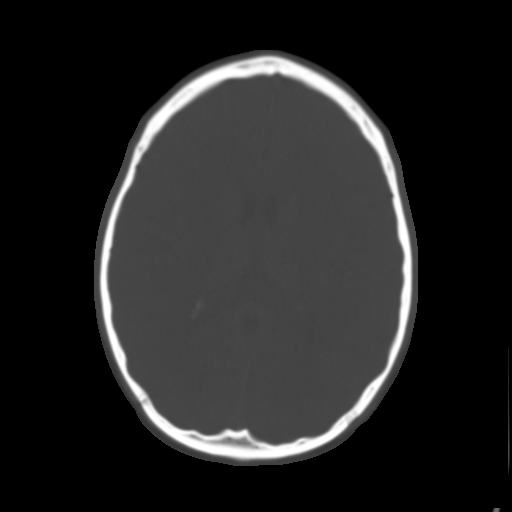
[im 17/31  brain]
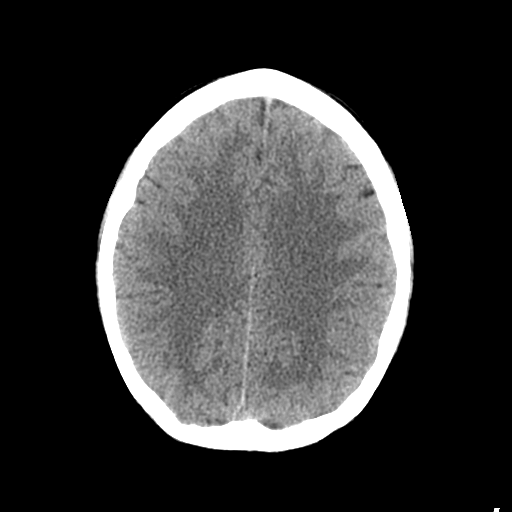
[im 20/31  brain]
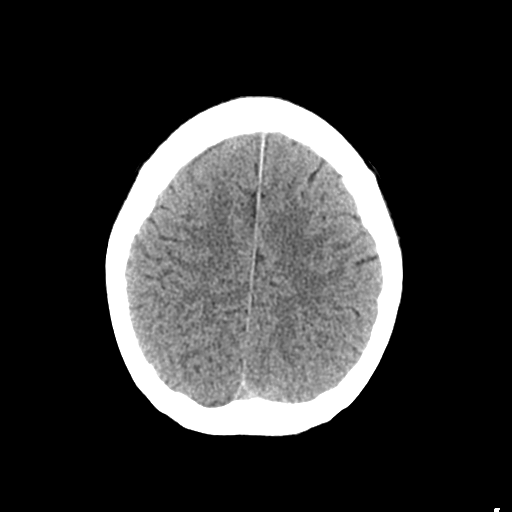
[im 23/31  brain]
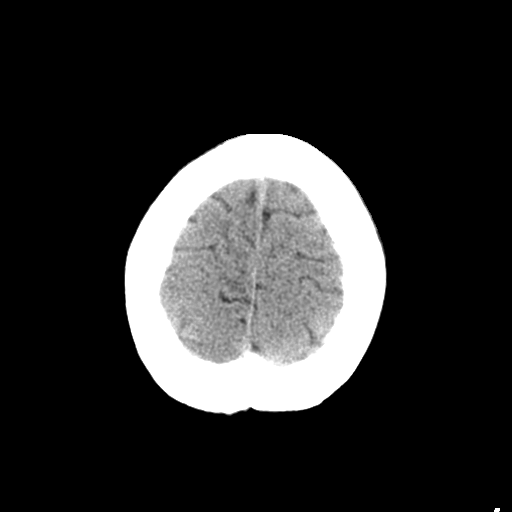
[im 25/31  brain]
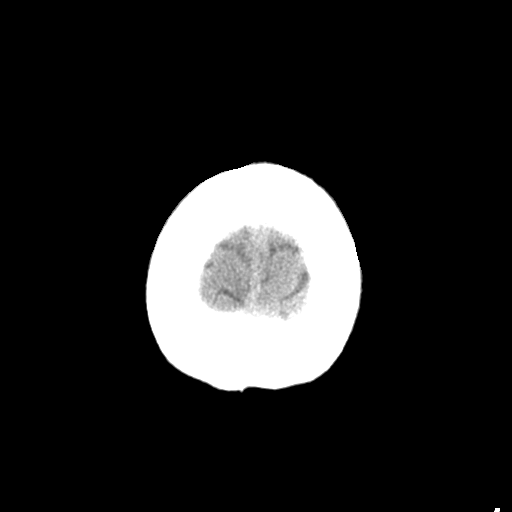
[im 25/31  bone]
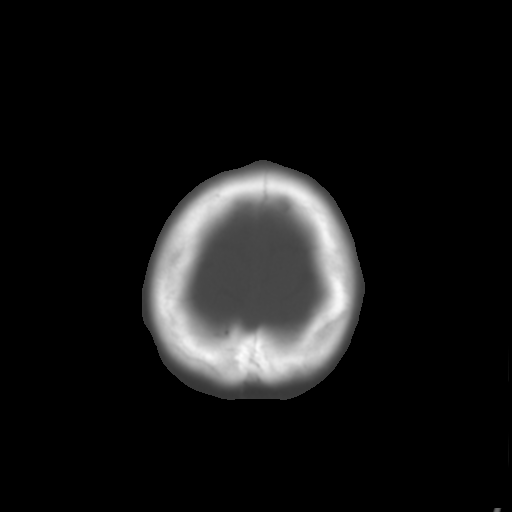
[im 28/31  brain]
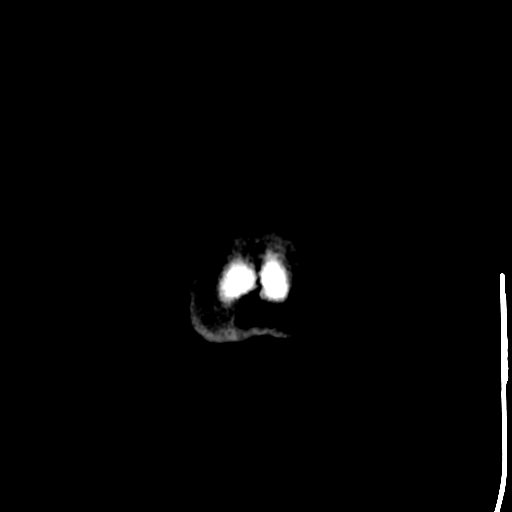

[Series 5: coronal soft tissue · coronal · 0.31mm/px · 3 of 69 slices shown]
[im 23/69  brain]
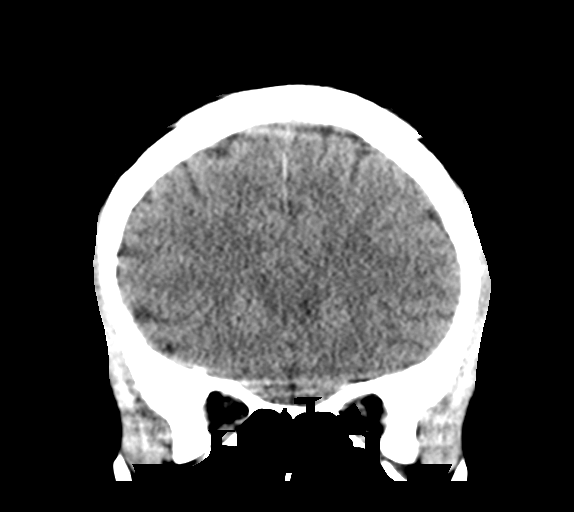
[im 31/69  brain]
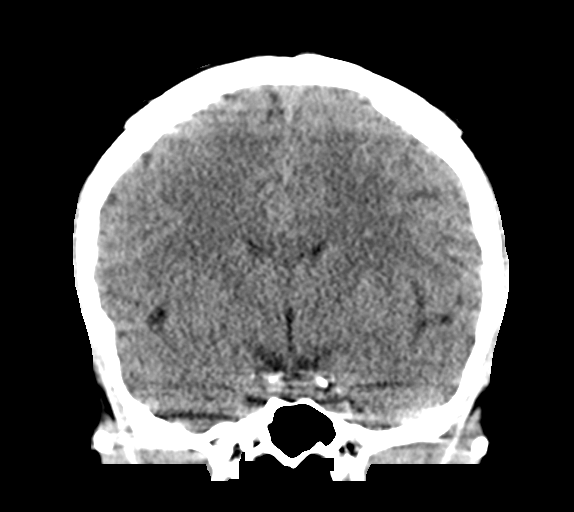
[im 38/69  brain]
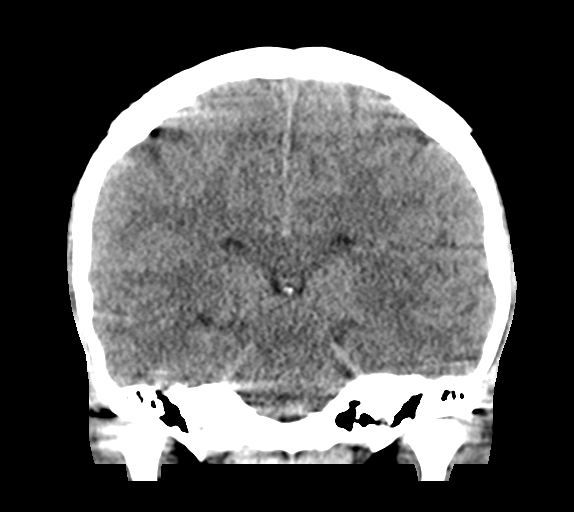

[Series 6: sagittal soft tissue · sagittal · 0.31mm/px · 3 of 60 slices shown]
[im 20/60  brain]
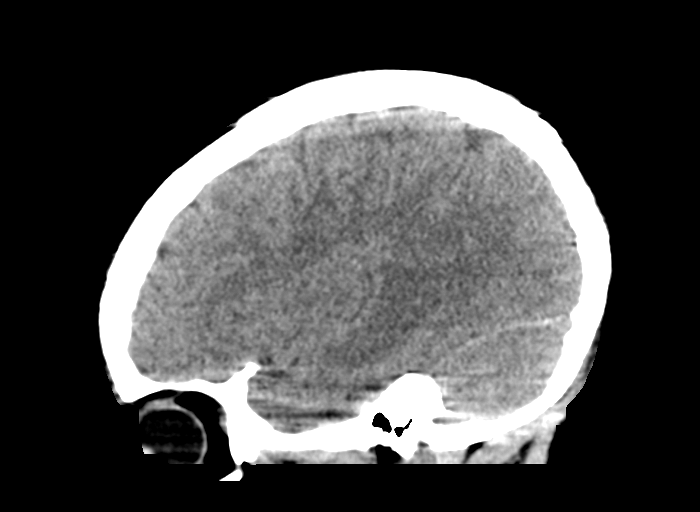
[im 30/60  brain]
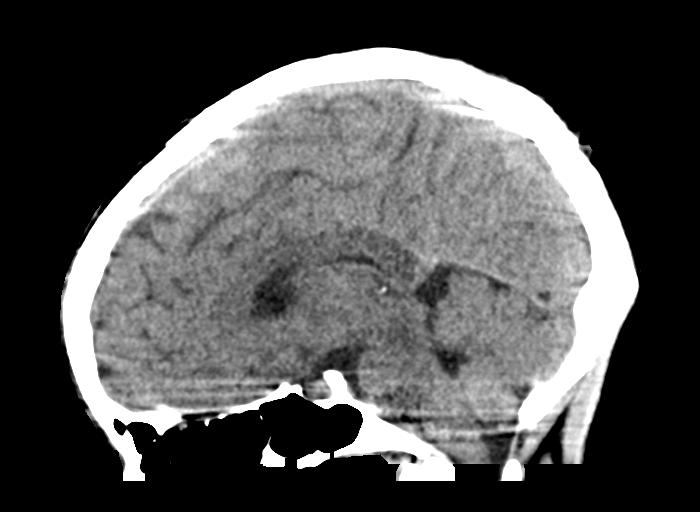
[im 40/60  brain]
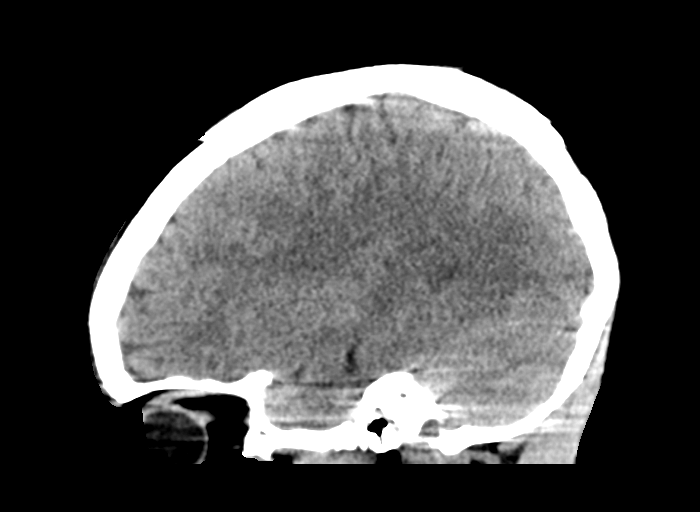

[16 of 47 positions shown; findings below may reference images not displayed]

FINDINGS: Brain: No evidence of acute infarction, hemorrhage, hydrocephalus,
extra-axial collection or mass lesion/mass effect.

Vascular: No hyperdense vessel or unexpected calcification.

Skull: Normal. Negative for fracture or focal lesion.

Sinuses/Orbits: No acute finding.

Other: None.
IMPRESSION: No acute intracranial pathology.

## 2022-11-28 ENCOUNTER — Ambulatory Visit (INDEPENDENT_AMBULATORY_CARE_PROVIDER_SITE_OTHER): Payer: Medicaid Other | Admitting: Primary Care

## 2023-09-12 ENCOUNTER — Ambulatory Visit
Admission: EM | Admit: 2023-09-12 | Discharge: 2023-09-12 | Disposition: A | Discharge status: Home / Self Care | Payer: Medicaid Other | Attending: Family Medicine | Admitting: Family Medicine

## 2023-09-12 DIAGNOSIS — J069 Acute upper respiratory infection, unspecified: Secondary | ICD-10-CM | POA: Diagnosis not present

## 2023-09-12 LAB — POC SARS CORONAVIRUS 2 AG -  ED: SARS Coronavirus 2 Ag: NEGATIVE

## 2023-09-12 MED ORDER — AZITHROMYCIN 250 MG PO TABS: ORAL_TABLET | ORAL | 0 refills | Status: AC

## 2023-09-12 NOTE — Discharge Instructions (Signed)
Recommend repeating a home COVID test tomorrow. Take plain guaifenesin (1200mg  extended release tabs such as Mucinex) twice daily, with plenty of water, for cough and congestion.  May add Pseudoephedrine (30mg , one or two every 4 to 6 hours) for sinus congestion.  Get adequate rest.   May use Afrin nasal spray (or generic oxymetazoline) each morning for about 5 days and then discontinue.  Also recommend using saline nasal spray several times daily and saline nasal irrigation (AYR is a common brand).  Use Flonase nasal spray each morning after using Afrin nasal spray and saline nasal irrigation. Try warm salt water gargles for sore throat.  Stop all antihistamines for now, and other non-prescription cough/cold preparations. May take Delsym Cough Suppressant ("12 Hour Cough Relief") at bedtime for nighttime cough.  Begin Azithromycin if not improving about one week or if persistent fever develops

## 2023-09-12 NOTE — ED Provider Notes (Signed)
[facility]    CSN: [id number] Arrival date & time: 09/12/23  7829      History   Chief Complaint Chief Complaint  Patient presents with   Nasal Congestion    HPI Elaine Hubbard is a 32 y.o. female.   Patient reports that two weeks ago she developed a sore throat followed by sinus congestion, sneezing, and a mild cough.  These symptoms lasted for about 5 days before resolving.  Three days ago she developed another sore throat with sinus congestion, sneezing, and mild productive cough.  She denies fever, however, and does not feel ill.  The history is provided by the patient.    History reviewed. No pertinent past medical history.  Patient Active Problem List   Diagnosis Date Noted   Attention deficit 02/25/2019   Behavior concern in adult 02/25/2019    History reviewed. No pertinent surgical history.  OB History   No obstetric history on file.      Home Medications    Prior to Admission medications   Medication Sig Start Date End Date Taking? Authorizing Provider  azithromycin (ZITHROMAX Z-PAK) 250 MG tablet Take 2 tabs today; then begin one tab once daily for 4 more days. 09/12/23  Yes Lattie Haw, MD  ibuprofen (ADVIL) 200 MG tablet Take 400 mg by mouth every 6 (six) hours as needed for mild pain or moderate pain.    [provider]    Family History History reviewed. No pertinent family history.  Social History Social History   Tobacco Use   Smoking status: Never   Smokeless tobacco: Never  Substance Use Topics   Alcohol use: No    Alcohol/week: 0.0 standard drinks of alcohol   Drug use: No     Allergies   Patient has no known allergies.   Review of Systems Review of Systems + sore throat + cough No pleuritic pain No wheezing + nasal congestion + sneezing ? post-nasal drainage No sinus pain/pressure No itchy/red eyes No earache No hemoptysis No SOB No fever/chills No nausea No vomiting No abdominal  pain No diarrhea No urinary symptoms No skin rash No fatigue No myalgias + headache Used OTC meds (Theraflu) without relief   Physical Exam Triage Vital Signs ED Triage Vitals [09/12/23 1031]  Encounter Vitals Group     BP 113/76     Systolic BP Percentile      Diastolic BP Percentile      Pulse Rate 71     Resp 17     Temp 98.6 F (37 C)     Temp Source Oral     SpO2 98 %     Weight      Height      Head Circumference      Peak Flow      Pain Score 0     Pain Loc      Pain Education      Exclude from Growth Chart    No data found.  Updated Vital Signs BP 113/76 (BP Location: Right Arm)   Pulse 71   Temp 98.6 F (37 C) (Oral)   Resp 17   LMP  (LMP Unknown)   SpO2 98%   Visual Acuity Right Eye Distance:   Left Eye Distance:   Bilateral Distance:    Right Eye Near:   Left Eye Near:    Bilateral Near:     Physical Exam Nursing notes and Vital Signs reviewed. Appearance:  Patient appears stated  age, and in no acute distress Eyes:  Pupils are equal, round, and reactive to light and accomodation.  Extraocular movement is intact.  Conjunctivae are not inflamed  Ears:  Canals normal.  Tympanic membranes normal.  Nose:  Mildly congested turbinates.  No sinus tenderness.   Pharynx:  Normal Neck:  Supple.  Mildly enlarged lateral nodes are present, tender to palpation on the left.   Lungs:  Clear to auscultation.  Breath sounds are equal.  Moving air well. Heart:  Regular rate and rhythm without murmurs, rubs, or gallops.  Abdomen:  Nontender without masses or hepatosplenomegaly.  Bowel sounds are present.  No CVA or flank tenderness.  Extremities:  No edema.  Skin:  No rash present.   UC Treatments / Results  Labs (all labs ordered are listed, but only abnormal results are displayed) Labs Reviewed  POC SARS CORONAVIRUS 2 AG -  ED negative    EKG   Radiology No results found.  Procedures Procedures (including critical care time)  Medications  Ordered in UC Medications - No data to display  Initial Impression / Assessment and Plan / UC Course  I have reviewed the triage vital signs and the nursing notes.  Pertinent labs & imaging results that were available during my care of the patient were reviewed by me and considered in my medical decision making (see chart for details).    Benign exam.  Suspect a second viral URI within a short period of time. There is no evidence of bacterial infection today.  Treat symptomatically for now. If not improving in about one week or if symptoms, begin Z-pack (Given a prescription to hold, with an expiration date)  Followup with Family Doctor if not improved in about 10 days.  Final Clinical Impressions(s) / UC Diagnoses   Final diagnoses:  Viral URI with cough     Discharge Instructions      Recommend repeating a home COVID test tomorrow. Take plain guaifenesin (1200mg  extended release tabs such as Mucinex) twice daily, with plenty of water, for cough and congestion.  May add Pseudoephedrine (30mg , one or two every 4 to 6 hours) for sinus congestion.  Get adequate rest.   May use Afrin nasal spray (or generic oxymetazoline) each morning for about 5 days and then discontinue.  Also recommend using saline nasal spray several times daily and saline nasal irrigation (AYR is a common brand).  Use Flonase nasal spray each morning after using Afrin nasal spray and saline nasal irrigation. Try warm salt water gargles for sore throat.  Stop all antihistamines for now, and other non-prescription cough/cold preparations. May take Delsym Cough Suppressant ("12 Hour Cough Relief") at bedtime for nighttime cough.  Begin Azithromycin if not improving about one week or if persistent fever develops      ED Prescriptions     Medication Sig Dispense Auth. Provider   azithromycin (ZITHROMAX Z-PAK) 250 MG tablet Take 2 tabs today; then begin one tab once daily for 4 more days. 6 tablet Lattie Haw,  MD         Lattie Haw, MD 09/13/23 2138

## 2023-09-12 NOTE — ED Triage Notes (Signed)
Pt c/o nasal congestion x 2 weeks. Denies fever. Sxs worsening in last two days. Theraflu prn.

## 2024-06-24 ENCOUNTER — Encounter (HOSPITAL_COMMUNITY): Payer: Self-pay

## 2024-06-24 ENCOUNTER — Emergency Department (HOSPITAL_COMMUNITY)

## 2024-06-24 ENCOUNTER — Emergency Department (HOSPITAL_COMMUNITY): Admission: EM | Admit: 2024-06-24 | Discharge: 2024-06-24 | Disposition: A | Discharge status: Home / Self Care

## 2024-06-24 ENCOUNTER — Other Ambulatory Visit: Payer: Self-pay

## 2024-06-24 DIAGNOSIS — N83201 Unspecified ovarian cyst, right side: Secondary | ICD-10-CM | POA: Insufficient documentation

## 2024-06-24 DIAGNOSIS — N83202 Unspecified ovarian cyst, left side: Secondary | ICD-10-CM | POA: Insufficient documentation

## 2024-06-24 LAB — COMPREHENSIVE METABOLIC PANEL WITH GFR
ALT: 18 U/L (ref 0–44)
AST: 21 U/L (ref 15–41)
Albumin: 4.3 g/dL (ref 3.5–5.0)
Alkaline Phosphatase: 33 U/L — ABNORMAL LOW (ref 38–126)
Anion gap: 10 (ref 5–15)
BUN: 8 mg/dL (ref 6–20)
CO2: 22 mmol/L (ref 22–32)
Calcium: 9 mg/dL (ref 8.9–10.3)
Chloride: 103 mmol/L (ref 98–111)
Creatinine, Ser: 0.61 mg/dL (ref 0.44–1.00)
GFR, Estimated: >60 mL/min (ref >60)
Glucose, Bld: 99 mg/dL (ref 70–99)
Potassium: 3.5 mmol/L (ref 3.5–5.1)
Sodium: 135 mmol/L (ref 135–145)
Total Bilirubin: 1.3 mg/dL — ABNORMAL HIGH (ref 0.0–1.2)
Total Protein: 7.3 g/dL (ref 6.5–8.1)

## 2024-06-24 LAB — URINALYSIS, W/ REFLEX TO CULTURE (INFECTION SUSPECTED)
Bilirubin Urine: NEGATIVE
Glucose, UA: NEGATIVE mg/dL
Ketones, ur: NEGATIVE mg/dL
Leukocytes,Ua: NEGATIVE
Nitrite: NEGATIVE
Protein, ur: NEGATIVE mg/dL
Specific Gravity, Urine: 1.009 (ref 1.005–1.030)
pH: 6 (ref 5.0–8.0)

## 2024-06-24 LAB — CBC WITH DIFFERENTIAL/PLATELET
Abs Immature Granulocytes: 0.01 K/uL (ref 0.00–0.07)
Basophils Absolute: 0 K/uL (ref 0.0–0.1)
Basophils Relative: 1 %
Eosinophils Absolute: 0 K/uL (ref 0.0–0.5)
Eosinophils Relative: 1 %
HCT: 36.8 % (ref 36.0–46.0)
Hemoglobin: 11.9 g/dL — ABNORMAL LOW (ref 12.0–15.0)
Immature Granulocytes: 0 %
Lymphocytes Relative: 20 %
Lymphs Abs: 1.1 K/uL (ref 0.7–4.0)
MCH: 26.6 pg (ref 26.0–34.0)
MCHC: 32.3 g/dL (ref 30.0–36.0)
MCV: 82.3 fL (ref 80.0–100.0)
Monocytes Absolute: 0.3 K/uL (ref 0.1–1.0)
Monocytes Relative: 5 %
Neutro Abs: 4.1 K/uL (ref 1.7–7.7)
Neutrophils Relative %: 73 %
Platelets: 219 K/uL (ref 150–400)
RBC: 4.47 MIL/uL (ref 3.87–5.11)
RDW: 13.2 % (ref 11.5–15.5)
WBC: 5.5 K/uL (ref 4.0–10.5)
nRBC: 0 % (ref 0.0–0.2)

## 2024-06-24 LAB — WET PREP, GENITAL
Clue Cells Wet Prep HPF POC: NONE SEEN
Sperm: NONE SEEN
Trich, Wet Prep: NONE SEEN
WBC, Wet Prep HPF POC: >=10 — AB (ref <10)
Yeast Wet Prep HPF POC: NONE SEEN

## 2024-06-24 LAB — I-STAT CHEM 8, ED
BUN: 7 mg/dL (ref 6–20)
Calcium, Ion: 1.13 mmol/L — ABNORMAL LOW (ref 1.15–1.40)
Chloride: 104 mmol/L (ref 98–111)
Creatinine, Ser: 0.6 mg/dL (ref 0.44–1.00)
Glucose, Bld: 95 mg/dL (ref 70–99)
HCT: 38 % (ref 36.0–46.0)
Hemoglobin: 12.9 g/dL (ref 12.0–15.0)
Potassium: 3.5 mmol/L (ref 3.5–5.1)
Sodium: 139 mmol/L (ref 135–145)
TCO2: 23 mmol/L (ref 22–32)

## 2024-06-24 LAB — LIPASE, BLOOD: Lipase: 27 U/L (ref 11–51)

## 2024-06-24 LAB — HCG, SERUM, QUALITATIVE: Preg, Serum: NEGATIVE

## 2024-06-24 NOTE — ED Provider Notes (Signed)
 [place] EMERGENCY DEPARTMENT AT [facility] Provider Note   CSN: [id number] Arrival date & time: 06/24/24  9094     Patient presents with: Abdominal Cramping   Elaine Hubbard is a 33 y.o. female.   Elaine Hubbard is a 33 y.o. female otherwise healthy, presents to the emergency department for evaluation of lower abdominal and pelvic pain.  Patient reports pain started yesterday and was initially more mild cramping pain but then became more consistent especially overnight at times she described pain as a 10 out of 10, currently reports 3/10.  She reports that the pain will intermittently let up but never completely resolves.  She reports that there is pain on both sides but slightly worse on the left side.  She denies flank or back pain, urinary frequency or hematuria.  She reports she is not on her menstrual cycle and her last cycle was 2 weeks ago and normal.  Currently sexually active with 1 partner and denies concern for STI.  Did have some discomfort with intercourse yesterday.  Denies any vaginal discharge.  Prior abdominal surgeries.  No history of similar pain.  The history is provided by the patient and medical records.  Abdominal Cramping Associated symptoms include abdominal pain. Pertinent negatives include no chest pain and no shortness of breath.       Prior to Admission medications   Medication Sig Start Date End Date Taking? Authorizing Provider  azithromycin  (ZITHROMAX  Z-PAK) 250 MG tablet Take 2 tabs today; then begin one tab once daily for 4 more days. 09/12/23   Pauline Garnette LABOR, MD  ibuprofen (ADVIL) 200 MG tablet Take 400 mg by mouth every 6 (six) hours as needed for mild pain or moderate pain.    [provider]    Allergies: Patient has no known allergies.    Review of Systems  Constitutional:  Negative for chills and fever.  Respiratory:  Negative for cough and shortness of breath.   Cardiovascular:  Negative for chest pain.   Gastrointestinal:  Positive for abdominal pain. Negative for nausea and vomiting.  Genitourinary:  Positive for pelvic pain. Negative for dysuria, flank pain, frequency, hematuria, vaginal bleeding and vaginal discharge.  All other systems reviewed and are negative.   Updated Vital Signs BP 113/72 (BP Location: Right Arm)   Pulse 90   Temp 98.1 F (36.7 C) (Oral)   Resp 16   Ht 5' 1 (1.549 m)   Wt 49.9 kg   SpO2 100%   BMI 20.78 kg/m   Physical Exam Vitals and nursing note reviewed.  Constitutional:      General: She is not in acute distress.    Appearance: Normal appearance. She is well-developed and normal weight. She is not ill-appearing or diaphoretic.  HENT:     Head: Normocephalic and atraumatic.     Mouth/Throat:     Mouth: Mucous membranes are moist.     Pharynx: Oropharynx is clear.  Eyes:     General:        Right eye: No discharge.        Left eye: No discharge.  Cardiovascular:     Rate and Rhythm: Normal rate and regular rhythm.     Pulses: Normal pulses.     Heart sounds: Normal heart sounds.  Pulmonary:     Effort: Pulmonary effort is normal. No respiratory distress.     Breath sounds: Normal breath sounds. No wheezing or rales.     Comments: Respirations equal and  unlabored, patient able to speak in full sentences, lungs clear to auscultation bilaterally  Abdominal:     General: Bowel sounds are normal. There is no distension.     Palpations: Abdomen is soft. There is no mass.     Tenderness: There is abdominal tenderness. There is no guarding.     Comments: Abdomen soft, nondistended, tenderness noted in left lower quadrant and suprapubic region without guarding or rebound tenderness   Genitourinary:    Comments: Chaperone present during pelvic exam.  No external genital lesions noted.  Thin white discharge present, cervix and vaginal walls without erythema or friability, on bimanual exam no cervical motion tenderness, mild left adnexal tenderness  present Musculoskeletal:        General: No deformity.     Cervical back: Neck supple.  Skin:    General: Skin is warm and dry.     Capillary Refill: Capillary refill takes less than 2 seconds.  Neurological:     Mental Status: She is alert and oriented to person, place, and time.     Coordination: Coordination normal.     Comments: Speech is clear, able to follow commands CN III-XII intact Normal strength in upper and lower extremities bilaterally including dorsiflexion and plantar flexion, strong and equal grip strength Sensation normal to light and sharp touch Moves extremities without ataxia, coordination intact  Psychiatric:        Mood and Affect: Mood normal.        Behavior: Behavior normal.     (all labs ordered are listed, but only abnormal results are displayed) Labs Reviewed  WET PREP, GENITAL - Abnormal; Notable for the following components:      Result Value   WBC, Wet Prep HPF POC >=10 (*)    All other components within normal limits  COMPREHENSIVE METABOLIC PANEL WITH GFR - Abnormal; Notable for the following components:   Alkaline Phosphatase 33 (*)    Total Bilirubin 1.3 (*)    All other components within normal limits  CBC WITH DIFFERENTIAL/PLATELET - Abnormal; Notable for the following components:   Hemoglobin 11.9 (*)    All other components within normal limits  URINALYSIS, W/ REFLEX TO CULTURE (INFECTION SUSPECTED) - Abnormal; Notable for the following components:   Hgb urine dipstick SMALL (*)    Bacteria, UA RARE (*)    All other components within normal limits  I-STAT CHEM 8, ED - Abnormal; Notable for the following components:   Calcium, Ion 1.13 (*)    All other components within normal limits  HCG, SERUM, QUALITATIVE  LIPASE, BLOOD  GC/CHLAMYDIA PROBE AMP ([place]) NOT AT [facility]    EKG: None  Radiology: US  Pelvis Complete Result Date: 06/24/2024 CLINICAL DATA:  One day history of pelvic pain EXAM: TRANSABDOMINAL AND TRANSVAGINAL  ULTRASOUND OF PELVIS DOPPLER ULTRASOUND OF OVARIES TECHNIQUE: Both transabdominal and transvaginal ultrasound examinations of the pelvis were performed. Transabdominal technique was performed for global imaging of the pelvis including uterus, ovaries, adnexal regions, and pelvic cul-de-sac. It was necessary to proceed with endovaginal exam following the transabdominal exam to visualize the pelvic structures. Color and duplex Doppler ultrasound was utilized to evaluate blood flow to the ovaries. COMPARISON:  None Available. FINDINGS: Uterus Measurements: 6.4 cm in sagittal dimension. No fibroids or other mass visualized. Endometrium Thickness: 12 mm.  No focal abnormality visualized. Right ovary Measurements: 3.7 x 2.6 x 2.1 cm = volume: 10.6 mL. Thick-walled, peripherally hyperemic cystic focus measures 2.0 x 1.9 x 1.4 cm with heterogeneous  internal echogenicity. Left ovary Measurements: 2.8 x 2.6 x 2.3 cm = volume: 8.7 mL. Contains a peripherally hyperemic cyst with layering internal echogenicities measuring 1.7 x 1.6 x 1.2 cm. Pulsed Doppler evaluation of both ovaries demonstrates normal low-resistance arterial and venous waveforms in the left ovary. Right ovarian peak systolic velocity is asymmetrically elevated. Other findings Small volume pelvic free fluid. IMPRESSION: 1. Thick-walled, peripherally hyperemic cystic focus in the right ovary measuring 2.0 cm with heterogeneous internal echogenicity and asymmetrically elevated right ovarian peak systolic velocity. While this structure may represent an involuting corpus luteum, epithelial ovarian neoplasm can have a similar appearance and a repeat ultrasound examination is recommended in 6 weeks to ensure resolution. Alternatively, nonemergent pelvic MRI can be considered. 2. Peripherally hyperemic cyst in the left ovary measuring 1.7 cm with layering internal echogenicities, likely a hemorrhagic cyst. 3. Small volume pelvic free fluid. Electronically Signed   By:  Limin  Xu M.D.   On: 06/24/2024 13:30   US  Transvaginal Non-OB Result Date: 06/24/2024 CLINICAL DATA:  One day history of pelvic pain EXAM: TRANSABDOMINAL AND TRANSVAGINAL ULTRASOUND OF PELVIS DOPPLER ULTRASOUND OF OVARIES TECHNIQUE: Both transabdominal and transvaginal ultrasound examinations of the pelvis were performed. Transabdominal technique was performed for global imaging of the pelvis including uterus, ovaries, adnexal regions, and pelvic cul-de-sac. It was necessary to proceed with endovaginal exam following the transabdominal exam to visualize the pelvic structures. Color and duplex Doppler ultrasound was utilized to evaluate blood flow to the ovaries. COMPARISON:  None Available. FINDINGS: Uterus Measurements: 6.4 cm in sagittal dimension. No fibroids or other mass visualized. Endometrium Thickness: 12 mm.  No focal abnormality visualized. Right ovary Measurements: 3.7 x 2.6 x 2.1 cm = volume: 10.6 mL. Thick-walled, peripherally hyperemic cystic focus measures 2.0 x 1.9 x 1.4 cm with heterogeneous internal echogenicity. Left ovary Measurements: 2.8 x 2.6 x 2.3 cm = volume: 8.7 mL. Contains a peripherally hyperemic cyst with layering internal echogenicities measuring 1.7 x 1.6 x 1.2 cm. Pulsed Doppler evaluation of both ovaries demonstrates normal low-resistance arterial and venous waveforms in the left ovary. Right ovarian peak systolic velocity is asymmetrically elevated. Other findings Small volume pelvic free fluid. IMPRESSION: 1. Thick-walled, peripherally hyperemic cystic focus in the right ovary measuring 2.0 cm with heterogeneous internal echogenicity and asymmetrically elevated right ovarian peak systolic velocity. While this structure may represent an involuting corpus luteum, epithelial ovarian neoplasm can have a similar appearance and a repeat ultrasound examination is recommended in 6 weeks to ensure resolution. Alternatively, nonemergent pelvic MRI can be considered. 2. Peripherally  hyperemic cyst in the left ovary measuring 1.7 cm with layering internal echogenicities, likely a hemorrhagic cyst. 3. Small volume pelvic free fluid. Electronically Signed   By: Limin  Xu M.D.   On: 06/24/2024 13:30   US  Art/Ven Flow Abd Pelv Doppler Result Date: 06/24/2024 CLINICAL DATA:  One day history of pelvic pain EXAM: TRANSABDOMINAL AND TRANSVAGINAL ULTRASOUND OF PELVIS DOPPLER ULTRASOUND OF OVARIES TECHNIQUE: Both transabdominal and transvaginal ultrasound examinations of the pelvis were performed. Transabdominal technique was performed for global imaging of the pelvis including uterus, ovaries, adnexal regions, and pelvic cul-de-sac. It was necessary to proceed with endovaginal exam following the transabdominal exam to visualize the pelvic structures. Color and duplex Doppler ultrasound was utilized to evaluate blood flow to the ovaries. COMPARISON:  None Available. FINDINGS: Uterus Measurements: 6.4 cm in sagittal dimension. No fibroids or other mass visualized. Endometrium Thickness: 12 mm.  No focal abnormality visualized. Right ovary Measurements: 3.7 x 2.6  x 2.1 cm = volume: 10.6 mL. Thick-walled, peripherally hyperemic cystic focus measures 2.0 x 1.9 x 1.4 cm with heterogeneous internal echogenicity. Left ovary Measurements: 2.8 x 2.6 x 2.3 cm = volume: 8.7 mL. Contains a peripherally hyperemic cyst with layering internal echogenicities measuring 1.7 x 1.6 x 1.2 cm. Pulsed Doppler evaluation of both ovaries demonstrates normal low-resistance arterial and venous waveforms in the left ovary. Right ovarian peak systolic velocity is asymmetrically elevated. Other findings Small volume pelvic free fluid. IMPRESSION: 1. Thick-walled, peripherally hyperemic cystic focus in the right ovary measuring 2.0 cm with heterogeneous internal echogenicity and asymmetrically elevated right ovarian peak systolic velocity. While this structure may represent an involuting corpus luteum, epithelial ovarian neoplasm  can have a similar appearance and a repeat ultrasound examination is recommended in 6 weeks to ensure resolution. Alternatively, nonemergent pelvic MRI can be considered. 2. Peripherally hyperemic cyst in the left ovary measuring 1.7 cm with layering internal echogenicities, likely a hemorrhagic cyst. 3. Small volume pelvic free fluid. Electronically Signed   By: Limin  Xu M.D.   On: 06/24/2024 13:30      Procedures   Medications Ordered in the ED - No data to display                                  Medical Decision Making Amount and/or Complexity of Data Reviewed Labs: ordered. Radiology: ordered.   Patient presents with lower abdominal and pelvic pain for 2 days.  No vaginal bleeding or vaginal discharge, no concern or high risk for sexually transmitted infections.  Suprapubic and left lower quadrant pain on abdominal exam with left adnexal tenderness on pelvic exam, no cervical motion tenderness to suggest PID.  Differential includes ovarian cyst, ovarian torsion, ovarian mass, PID, STI, UTI, kidney stone, diverticulitis, colitis, appendicitis, perforation, obstruction  Given the pelvic tenderness on exam we will start with pelvic ultrasound  Labs overall reassuring without leukocytosis, electrolyte derangement, normal renal and liver function noted as well as normal lipase.  UA without signs of infection or hematuria to suggest kidney stone, wet prep with WBCs present, negative pregnancy, GC/chlamydia is pending  Ultrasound shows bilateral ovarian cysts, on the right there is a thick-walled peripherally hyperemic cyst in the right ovary measuring 2 cm that is somewhat heterogeneous, could be a corpus luteum cyst versus epithelial ovarian neoplasm, will need repeat ultrasound in 6 weeks.  On the left ovary there is a 1.7 cm hemorrhagic appearing cyst that I suspect is the source of patient's pain, normal blood flow to both ovaries without suggestion of ovarian torsion.  Discussed these  imaging results with patient and discussed close follow-up with OB/GYN for repeat ultrasound and strict return precautions.  She expresses understanding and agreement.  Discussed NSAIDs, Tylenol and heat for pain management.  Discharged home in good condition.     Final diagnoses:  Cysts of both ovaries    ED Discharge Orders     None          Alva Larraine JULIANNA DEVONNA 07/01/24 0024    Ula Prentice SAUNDERS, MD 07/05/24 [phone]

## 2024-06-24 NOTE — ED Triage Notes (Signed)
 PT arrived via POV after new onset of pelvic pain and cramping described as fluctuating between 3/10 to 10/10 since yesterday. Denies discharge, bleeding or burning with urination. Last menstruation 2 weeks ago. States they've had intercourse in the past week. Aox4.

## 2024-06-24 NOTE — Discharge Instructions (Signed)
 Your ultrasound showed cysts on both ovaries, the left ovary has a hemorrhagic cyst which can often cause pelvic pain.  The right ovary has a cyst that appears more complex, may be a corpus luteal cyst that should resolve with your menstrual cycle.  Please call to schedule follow-up ultrasound with the [facility].  You can use ibuprofen 600 mg and Tylenol 650 mg as needed for pain.  If you develop worsened pain, heavy vaginal bleeding or other new or concerning symptoms please return for reevaluation.  You do have testing pending for STIs and will be called by phone in 1 to 2 days if any of these results are positive.

## 2024-06-25 LAB — GC/CHLAMYDIA PROBE AMP (~~LOC~~) NOT AT ARMC
Chlamydia: NEGATIVE
Comment: NEGATIVE
Comment: NORMAL
Neisseria Gonorrhea: NEGATIVE

## 2024-06-30 NOTE — ED Provider Notes (Incomplete)
 [place] EMERGENCY DEPARTMENT AT [facility] Provider Note   CSN: [id number] Arrival date & time: 06/24/24  9094     Patient presents with: Abdominal Cramping   LIANE TRIBBEY is a 33 y.o. female.  {Add pertinent medical, surgical, social history, OB history to HPI:32947} TAMEYAH KOCH is a 33 y.o. female otherwise healthy, presents to the emergency department for evaluation of lower abdominal and pelvic pain.  Patient reports pain started yesterday and was initially more mild cramping pain but then became more consistent especially overnight at times she described pain as a 10 out of 10, currently reports 3/10.  She reports that the pain will intermittently let up but never completely resolves.  She reports that there is pain on both sides but slightly worse on the left side.  She denies flank or back pain, urinary frequency or hematuria.  She reports she is not on her menstrual cycle and her last cycle was 2 weeks ago and normal.  Currently sexually active with 1 partner and denies concern for STI.  Did have some discomfort with intercourse yesterday.  Denies any vaginal discharge.  Prior abdominal surgeries.  No history of similar pain.  The history is provided by the patient and medical records.  Abdominal Cramping Associated symptoms include abdominal pain. Pertinent negatives include no chest pain and no shortness of breath.       Prior to Admission medications   Medication Sig Start Date End Date Taking? Authorizing Provider  azithromycin  (ZITHROMAX  Z-PAK) 250 MG tablet Take 2 tabs today; then begin one tab once daily for 4 more days. 09/12/23   Pauline Garnette LABOR, MD  ibuprofen (ADVIL) 200 MG tablet Take 400 mg by mouth every 6 (six) hours as needed for mild pain or moderate pain.    [provider]    Allergies: Patient has no known allergies.    Review of Systems  Constitutional:  Negative for chills and fever.  Respiratory:  Negative for cough and  shortness of breath.   Cardiovascular:  Negative for chest pain.  Gastrointestinal:  Positive for abdominal pain. Negative for nausea and vomiting.  Genitourinary:  Positive for pelvic pain. Negative for dysuria, flank pain, frequency, hematuria, vaginal bleeding and vaginal discharge.  All other systems reviewed and are negative.   Updated Vital Signs BP 113/72 (BP Location: Right Arm)   Pulse 90   Temp 98.1 F (36.7 C) (Oral)   Resp 16   Ht 5' 1 (1.549 m)   Wt 49.9 kg   SpO2 100%   BMI 20.78 kg/m   Physical Exam Vitals and nursing note reviewed.  Constitutional:      General: She is not in acute distress.    Appearance: Normal appearance. She is well-developed and normal weight. She is not ill-appearing or diaphoretic.  HENT:     Head: Normocephalic and atraumatic.     Mouth/Throat:     Mouth: Mucous membranes are moist.     Pharynx: Oropharynx is clear.  Eyes:     General:        Right eye: No discharge.        Left eye: No discharge.  Cardiovascular:     Rate and Rhythm: Normal rate and regular rhythm.     Pulses: Normal pulses.     Heart sounds: Normal heart sounds.  Pulmonary:     Effort: Pulmonary effort is normal. No respiratory distress.     Breath sounds: Normal breath sounds. No wheezing or  rales.     Comments: Respirations equal and unlabored, patient able to speak in full sentences, lungs clear to auscultation bilaterally  Abdominal:     General: Bowel sounds are normal. There is no distension.     Palpations: Abdomen is soft. There is no mass.     Tenderness: There is abdominal tenderness. There is no guarding.     Comments: Abdomen soft, nondistended, nontender to palpation in all quadrants without guarding or peritoneal signs  Musculoskeletal:        General: No deformity.     Cervical back: Neck supple.  Skin:    General: Skin is warm and dry.     Capillary Refill: Capillary refill takes less than 2 seconds.  Neurological:     Mental Status: She  is alert and oriented to person, place, and time.     Coordination: Coordination normal.     Comments: Speech is clear, able to follow commands CN III-XII intact Normal strength in upper and lower extremities bilaterally including dorsiflexion and plantar flexion, strong and equal grip strength Sensation normal to light and sharp touch Moves extremities without ataxia, coordination intact  Psychiatric:        Mood and Affect: Mood normal.        Behavior: Behavior normal.     (all labs ordered are listed, but only abnormal results are displayed) Labs Reviewed - No data to display  EKG: None  Radiology: No results found.  {Document cardiac monitor, telemetry assessment procedure when appropriate:32947} Procedures   Medications Ordered in the ED - No data to display    {Click here for ABCD2, HEART and other calculators REFRESH Note before signing:1}                              Medical Decision Making Amount and/or Complexity of Data Reviewed Labs: ordered. Radiology: ordered.   ***  {Document critical care time when appropriate  Document review of labs and clinical decision tools ie CHADS2VASC2, etc  Document your independent review of radiology images and any outside records  Document your discussion with family members, caretakers and with consultants  Document social determinants of health affecting pt's care  Document your decision making why or why not admission, treatments were needed:32947:::1}   Final diagnoses:  None    ED Discharge Orders     None

## 2024-10-13 ENCOUNTER — Other Ambulatory Visit (HOSPITAL_COMMUNITY): Payer: Self-pay

## 2024-10-13 MED ORDER — FLUZONE 0.5 ML IM SUSY
0.5000 mL | PREFILLED_SYRINGE | Freq: Once | INTRAMUSCULAR | 0 refills | Status: AC
  Filled 2024-10-13: qty 0.5, 1d supply, fill #0
